# Patient Record
Sex: Female | Born: 1965 | Race: White | Hispanic: No | Marital: Married | Smoking: Former smoker
Health system: Southern US, Community
[De-identification: ages and names within clinical notes are randomized; demographics above are authoritative.]

## PROBLEM LIST (undated history)

## (undated) DIAGNOSIS — O99353 Diseases of the nervous system complicating pregnancy, third trimester: Secondary | ICD-10-CM

## (undated) DIAGNOSIS — I10 Essential (primary) hypertension: Secondary | ICD-10-CM

## (undated) DIAGNOSIS — K219 Gastro-esophageal reflux disease without esophagitis: Secondary | ICD-10-CM

## (undated) DIAGNOSIS — I82409 Acute embolism and thrombosis of unspecified deep veins of unspecified lower extremity: Secondary | ICD-10-CM

## (undated) DIAGNOSIS — G510 Bell's palsy: Secondary | ICD-10-CM

## (undated) DIAGNOSIS — R51 Headache: Secondary | ICD-10-CM

## (undated) DIAGNOSIS — J302 Other seasonal allergic rhinitis: Secondary | ICD-10-CM

## (undated) DIAGNOSIS — Z9889 Other specified postprocedural states: Secondary | ICD-10-CM

## (undated) DIAGNOSIS — S022XXA Fracture of nasal bones, initial encounter for closed fracture: Secondary | ICD-10-CM

## (undated) DIAGNOSIS — R112 Nausea with vomiting, unspecified: Secondary | ICD-10-CM

## (undated) DIAGNOSIS — M1990 Unspecified osteoarthritis, unspecified site: Secondary | ICD-10-CM

## (undated) DIAGNOSIS — D569 Thalassemia, unspecified: Secondary | ICD-10-CM

## (undated) DIAGNOSIS — IMO0001 Reserved for inherently not codable concepts without codable children: Secondary | ICD-10-CM

## (undated) HISTORY — DX: Gastro-esophageal reflux disease without esophagitis: K21.9

## (undated) HISTORY — PX: DILATION AND CURETTAGE OF UTERUS: SHX78

## (undated) HISTORY — PX: ENDOMETRIAL ABLATION: SHX621

## (undated) HISTORY — PX: CERVICAL SPINE SURGERY: SHX589

## (undated) HISTORY — PX: CHOLECYSTECTOMY: SHX55

## (undated) HISTORY — PX: TONSILLECTOMY: SUR1361

## (undated) HISTORY — DX: Essential (primary) hypertension: I10

---
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
## 1995-06-01 DIAGNOSIS — G510 Bell's palsy: Secondary | ICD-10-CM

## 1995-06-01 HISTORY — DX: Bell's palsy: G51.0

## 2000-11-02 ENCOUNTER — Encounter: Admission: RE | Admit: 2000-11-02 | Discharge: 2000-11-02 | Payer: Self-pay | Admitting: Family Medicine

## 2000-11-02 ENCOUNTER — Ambulatory Visit (HOSPITAL_COMMUNITY): Admission: RE | Admit: 2000-11-02 | Discharge: 2000-11-02 | Payer: Self-pay | Admitting: Family Medicine

## 2000-11-02 ENCOUNTER — Encounter: Payer: Self-pay | Admitting: Family Medicine

## 2002-04-06 ENCOUNTER — Encounter: Payer: Self-pay | Admitting: Family Medicine

## 2002-04-06 ENCOUNTER — Encounter: Admission: RE | Admit: 2002-04-06 | Discharge: 2002-04-06 | Payer: Self-pay | Admitting: Family Medicine

## 2002-05-31 DIAGNOSIS — R519 Headache, unspecified: Secondary | ICD-10-CM

## 2002-05-31 HISTORY — DX: Headache, unspecified: R51.9

## 2005-09-16 ENCOUNTER — Encounter: Admission: RE | Admit: 2005-09-16 | Discharge: 2005-09-16 | Payer: Self-pay | Admitting: Family Medicine

## 2007-11-21 ENCOUNTER — Encounter: Admission: RE | Admit: 2007-11-21 | Discharge: 2007-11-21 | Payer: Self-pay | Admitting: Neurological Surgery

## 2008-12-10 ENCOUNTER — Ambulatory Visit (HOSPITAL_COMMUNITY): Admission: RE | Admit: 2008-12-10 | Discharge: 2008-12-11 | Payer: Self-pay | Admitting: Neurological Surgery

## 2009-01-14 ENCOUNTER — Encounter: Admission: RE | Admit: 2009-01-14 | Discharge: 2009-01-14 | Payer: Self-pay | Admitting: Neurological Surgery

## 2009-05-06 ENCOUNTER — Encounter: Admission: RE | Admit: 2009-05-06 | Discharge: 2009-05-06 | Payer: Self-pay | Admitting: Neurological Surgery

## 2009-08-19 ENCOUNTER — Encounter: Admission: RE | Admit: 2009-08-19 | Discharge: 2009-08-19 | Payer: Self-pay | Admitting: Neurological Surgery

## 2009-11-17 ENCOUNTER — Encounter: Admission: RE | Admit: 2009-11-17 | Discharge: 2009-11-17 | Payer: Self-pay | Admitting: Neurological Surgery

## 2010-03-17 ENCOUNTER — Encounter: Admission: RE | Admit: 2010-03-17 | Discharge: 2010-03-17 | Payer: Self-pay | Admitting: Neurological Surgery

## 2010-04-02 ENCOUNTER — Encounter: Admission: RE | Admit: 2010-04-02 | Discharge: 2010-04-02 | Payer: Self-pay | Admitting: Neurological Surgery

## 2010-06-21 ENCOUNTER — Encounter: Payer: Self-pay | Admitting: Family Medicine

## 2010-09-07 LAB — CBC
Hemoglobin: 11.3 g/dL — ABNORMAL LOW (ref 12.0–15.0)
MCHC: 32.3 g/dL (ref 30.0–36.0)
RBC: 5.02 MIL/uL (ref 3.87–5.11)

## 2010-09-07 LAB — BASIC METABOLIC PANEL
Calcium: 9.5 mg/dL (ref 8.4–10.5)
GFR calc non Af Amer: >60 mL/min (ref >60)
Glucose, Bld: 104 mg/dL — ABNORMAL HIGH (ref 70–99)
Sodium: 139 mEq/L (ref 135–145)

## 2010-09-07 LAB — DIFFERENTIAL
Lymphs Abs: 2 10*3/uL (ref 0.7–4.0)
Neutro Abs: 6.6 10*3/uL (ref 1.7–7.7)
Neutrophils Relative %: 69 % (ref 43–77)

## 2010-09-07 LAB — PROTIME-INR
INR: 1 (ref 0.00–1.49)
Prothrombin Time: 13.2 seconds (ref 11.6–15.2)

## 2010-10-13 NOTE — Op Note (Signed)
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   POSTOPERATIVE DIAGNOSIS:  Cervical spondylosis with cervical disk  herniation at C5-6, C6-7 with neck and arm pain.   PROCEDURES:  1. Decompressive anterior cervical diskectomy at C5-6 and C6-7.  2. Anterior cervical arthrodesis at C5-6, C6-7 utilizing 6-mm PEEK      interbody cages packed with local autograft and Trinity stem cell      product.  3. Anterior cervical plating C5-C7 inclusive utilizing a 38-mm      Orthofix plate.   SURGEON:  Tia Alert, MD   ASSISTANT:  Kathaleen Maser. Pool, MD   ANESTHESIA:  General endotracheal,   COMPLICATIONS:  Small unintended durotomy over the takeoff of the right  C7 nerve root.   BRIEF HISTORY OF PRESENT ILLNESS:  Ms. Samantha Christensen is a 45 year old female  who has a long history of neck pain with interscapular pain with  occasional radiation down the arms.  She had MRI in the remote past and  a more recent MRI which showed disk herniations at C5-6 to the right and  C6-7 to the left.  I recommended a two-level anterior cervical  diskectomy and fusional plating.  She understood the risks, benefits,  and expected outcome and wished to proceed.   DESCRIPTION OF PROCEDURE:  The patient was taken to the operating room  and after induction of adequate generalized endotracheal anesthesia she  was placed in supine position on the operating room table.  Her right  anterior cervical region was prepped with DuraPrep and then draped in  the usual sterile fashion.  Local anesthesia 5 mL was injected  and a  small transverse incision was made to the right of midline and carried  down to the platysma which was elevated, opened and then undermined with  Metzenbaum scissors.  I then dissected a plane medial to the  sternocleidomastoid muscle and internal carotid artery and lateral to  the trachea and esophagus to expose C5-6 and C6-7.  Intraoperative  fluoroscopy confirmed my level and then the longus colli muscles were  taken down and the Shadow Line retractors were placed under this to  expose C5-6 and C6-7.  The annulus was incised and the initial  diskectomy was done with pituitary rongeurs and curved Karlin curettes.  I then used the high-speed drill to drill the endplates to prepare for  later arthrodesis.  The drill shavings were saved in a mucous trap for  later arthrodesis.  We drilled down to the level of the posterior  longitudinal ligament which was then opened with a nerve hook at each  level and then removed circumferentially while undercutting  the bodies  of C5, C6 and C7.  Through decompression of the central canal at C5-6  there was a rightward disk herniation and small spur.  This was removed  by undercutting the C5-C6 vertebral bodies.  The C6 nerve roots were  identified and decompressed into the foramina distal to the pedicle.  We  then irrigated with saline solution, measured interspace to a 6 mm and  packed the interspace with Gelfoam.  The dura was full and capacious all  the way across.  We palpated with a nerve hook circumferentially to  assure adequate decompression.  The C6-7 level was decompressed in the  exact same way undercutting the bodies of C6-C7 to decompression of the  central canal until the dura relaxed and was full and capacious.  We  palpated the circumference of the nerve hook to assure adequate  decompression.  Unfortunately, while decompressing along the medial  pedicle wall and superior pedicle wall along the C7 nerve root on the  right a small  unintended durotomy was created and there was leakage of  spinal fluid.  This was covered with Gelfoam to stop the leak and the  remainder of the decompression was completed.  A coronary dilator was  again passed along the nerve roots to assure adequate decompression and  all of central canal to assure adequate decompression.  Therefore by  visualization and palpation we thought we had a good decompression of C6-  7 just as we did at C5-6.  We then measured the interspace again to be 6  mm.  We used two 6 mm PEEK interbody cages and packed these with local  autograft and Trinity product and then tapped this into position at C5-6  and C6-7 and checked this with fluoroscopy.  We then used a 38-mm  Orthofix plate and placed two 14-mm variable angle screws in the bodies  of C5 and C6 and fixed angle screws into C7.  I locked these into plate  with locking mechanism within the plate.  I then irrigated with saline  solution, dried all bleeding points with bipolar cautery and with  Surgifoam.  I then packed the right C6-7 foramen with a small amount of  Surgifoam and then Tisseel fibrin glue to help prevent CSF leak from the  C6-7 foramen on the right.  Once meticulous hemostasis was achieved I  closed the platysma with 3-0 Vicryl, closed the subcuticular tissue with  3-0 Vicryl and closed the skin with benzoin and Steri-Strips.  The  drapes removed and sterile dressing was applied.  The patient was  awakened from general anesthesia and transferred to the recovery room in  stable condition.  At the end of the procedure, all sponge, needle and  instrument counts were correct.      Tia Alert, MD  Electronically Signed     Tia Alert, MD  Electronically Signed    DSJ/MEDQ  D:  12/10/2008  T:  12/11/2008  Job:  [id number]

## 2010-12-09 IMAGING — CR DG CERVICAL SPINE 1V
1 series · 1 of 1 positions shown · non-contrast
Comparison: Fluoroscopic spot images from 12/10/2008

CLINICAL DATA: Cervical fusion on 12/10/2008.

CERVICAL SPINE - 1 VIEW

[w c-spine lat]
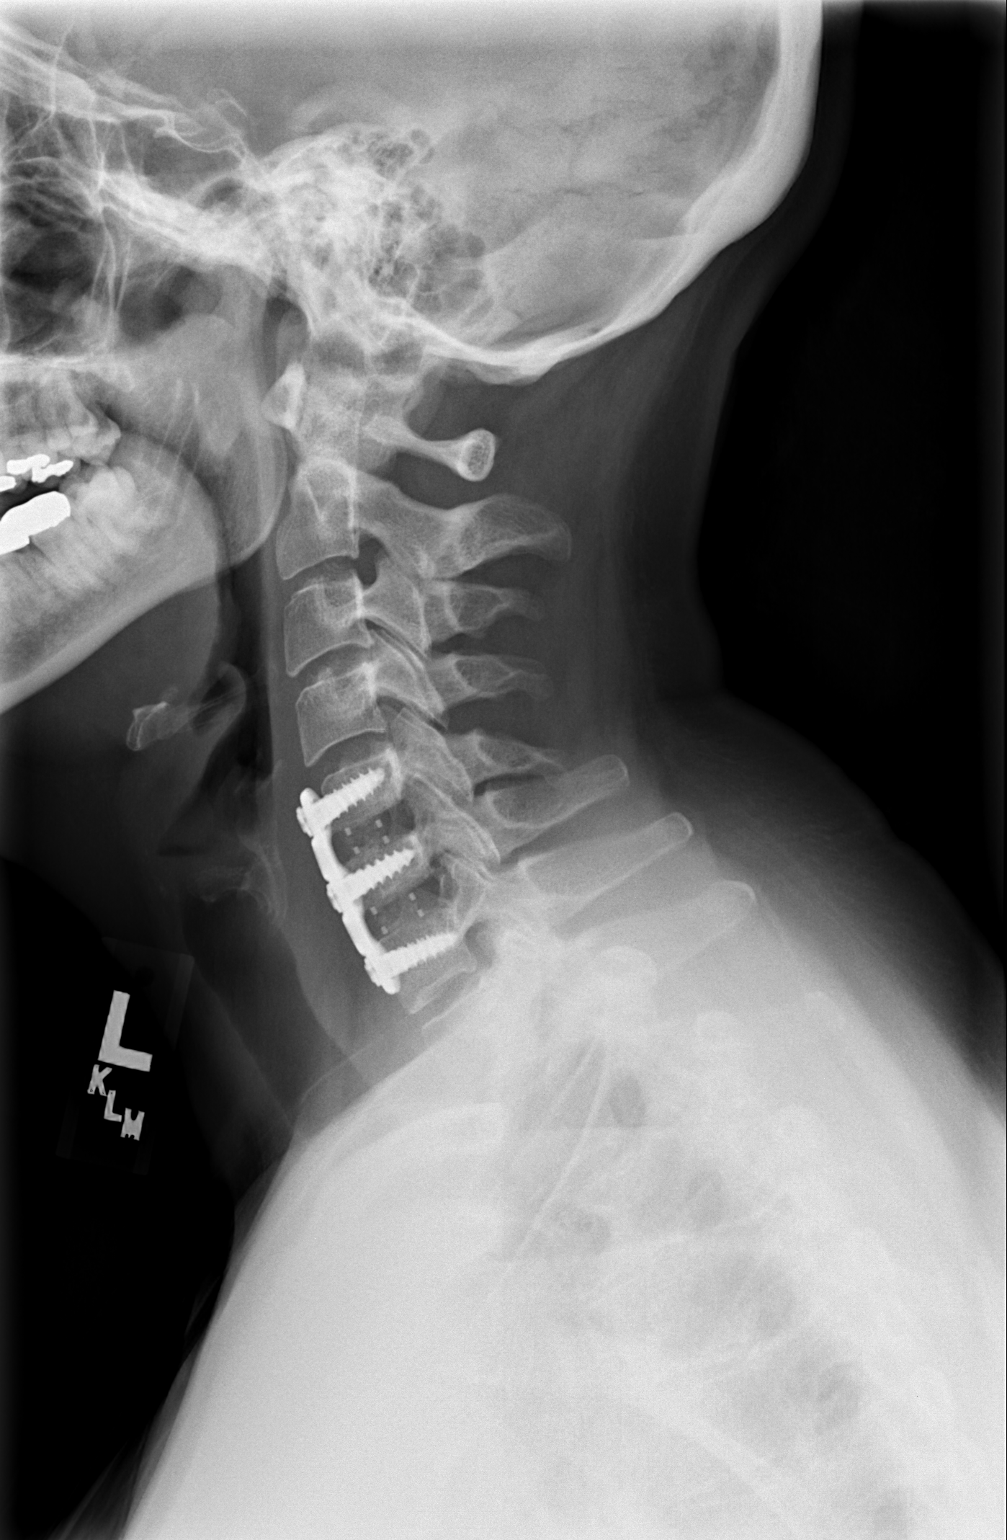

[1 of 1 positions shown; findings below may reference images not displayed]

FINDINGS: Anterior cervical discectomy and fusion noted at C5-C6-
C7, with anterior plate and screw fixator without complicating
feature.  Interbody spacers at C5-6 and C6-7 appear appropriately
positioned.  No malalignment or complicating features are observed.
IMPRESSION: 1.  Anterior fixation hardware at C5-C6-C7 is satisfactorily
positioned without complicating feature.  Interbody spacers appear
normally positioned as well.  No subluxation identified.

## 2011-01-18 ENCOUNTER — Other Ambulatory Visit: Payer: Self-pay | Admitting: Neurological Surgery

## 2011-01-18 DIAGNOSIS — M542 Cervicalgia: Secondary | ICD-10-CM

## 2011-01-20 ENCOUNTER — Ambulatory Visit
Admission: RE | Admit: 2011-01-20 | Discharge: 2011-01-20 | Disposition: A | Discharge status: Home / Self Care | Payer: BC Managed Care – PPO | Source: Ambulatory Visit | Attending: Neurological Surgery | Admitting: Neurological Surgery

## 2011-01-20 DIAGNOSIS — M542 Cervicalgia: Secondary | ICD-10-CM

## 2012-02-09 IMAGING — CR DG CERVICAL SPINE 1V
1 series · 1 of 1 positions shown · non-contrast
Comparison: 11/17/2009.

CLINICAL DATA: Neck pain.  Postop.

CERVICAL SPINE - 1 VIEW

[w c-spine lat]
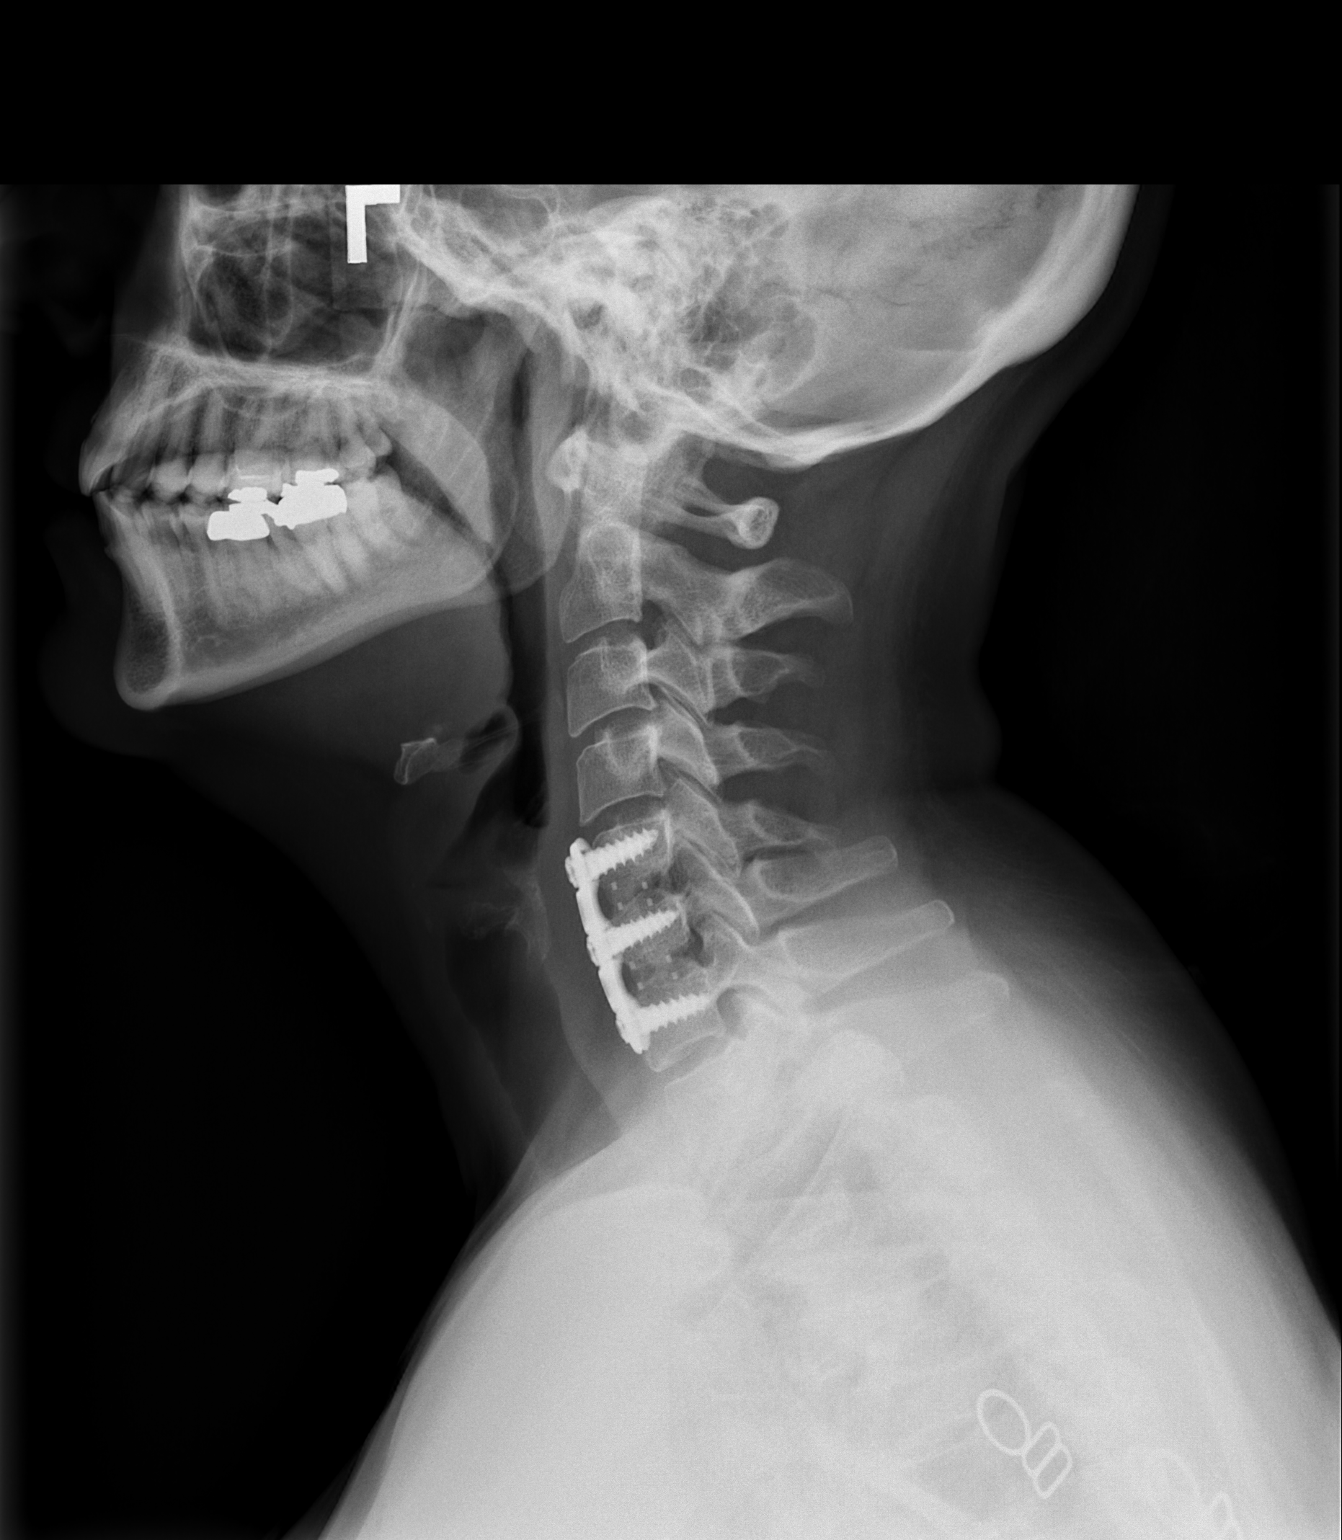

[1 of 1 positions shown; findings below may reference images not displayed]

FINDINGS: The cervical spine is visualized from the occiput to the
C7-T1 junction.  The patient is status post C5-7 anterior cervical
fusion with interbody spacers.  No hardware complications.
Alignment is anatomic.  Prevertebral soft tissues are within normal
limits.  Minimal uncovertebral hypertrophy above and below the
level of the fusion.
IMPRESSION: C5-C7 anterior cervical fusion without complicating feature.

## 2014-05-31 HISTORY — PX: KNEE ARTHROSCOPY: SHX127

## 2015-03-01 DIAGNOSIS — I82409 Acute embolism and thrombosis of unspecified deep veins of unspecified lower extremity: Secondary | ICD-10-CM

## 2015-03-01 HISTORY — DX: Acute embolism and thrombosis of unspecified deep veins of unspecified lower extremity: I82.409

## 2015-03-05 ENCOUNTER — Other Ambulatory Visit: Payer: Self-pay | Admitting: Orthopedic Surgery

## 2015-03-06 ENCOUNTER — Encounter (HOSPITAL_BASED_OUTPATIENT_CLINIC_OR_DEPARTMENT_OTHER): Payer: Self-pay | Admitting: *Deleted

## 2015-03-07 NOTE — H&P (Signed)
Samantha Christensen is an 49 y.o. female.   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  She had the immediate onset of deformity in the long finger, pain in the wrist and injured her nose/face.  She was evaluated at [facility] in the emergency room where x-rays were obtained.  It is my understanding that her long finger PIP dislocation was closed reduced, her distal radius fracture was splinted with a sugar tong splint in situ, and no additional radiographs were obtained.  She also had a laceration on the bridge of her nose addressed.  She has been taking intermittent narcotic pain medicines and Zofran as needed for nausea  Past Medical History  Diagnosis Date  . Thalassemia- hemoglobin I disease (HCC) 1985  . DJD (degenerative joint disease)   . PONV (postoperative nausea and vomiting)   . Nasal fracture     broke it when she broke her radius, stitches under nose.    Past Surgical History  Procedure Laterality Date  . Knee arthroscopy  2016  . Dilation and curettage of uterus    . Cholecystectomy    . Tonsillectomy    . Endometrial ablation    . Cervical spine surgery    . Cesarean section      x2    History reviewed. No pertinent family history. Social History:  reports that she quit smoking about 23 years ago. She has never used smokeless tobacco. She reports that she does not drink alcohol or use illicit drugs.  Allergies: No Known Allergies  No prescriptions prior to admission    No results found for this or any previous visit (from the past 48 hour(s)). No results found.  Review of Systems  All other systems reviewed and are negative.   Height  (1.626 m), weight 86.183 kg (190 lb). Physical Exam  Constitutional:  WD, WN,  NAD HEENT:  NCAT, EOMI Neuro/Psych:  Alert & oriented to person, place, and time; appropriate mood & affect Lymphatic: No generalized UE edema or lymphadenopathy Extremities / MSK:  Both UE are normal with respect to appearance, ranges of motion, joint stability, muscle strength/tone, sensation, & perfusion except as otherwise noted:  The sugar tong splint extends nearly to the PIP joints both palmarly and dorsally.  She also complains of how it cuts and around the elbow.  Therefore the sugar tong is removed.  The PIP joint of the long finger appears grossly reduced, and although the digit is swollen, there is reasonable motion at the PIP that seems smooth and fluid.  Intact light touch sensibility across the digital tips, and intact motor in radial, median, and ulnar nerve distributions.  The fingers are puffy.  Labs / Xrays:  3 views of the right long finger ordered and obtained as best could be done before the splint was removed reveals what appears to be a concentric reduction of the PIP joint without any significant fractures.  Injury x-rays are reviewed revealing a comminuted intra-articular distal radius fracture with 25 dorsal tilt and some intra-articular incongruity/loss of radial inclination/shortening.  Assessment: 1.  Status post closed reduction right long finger PIP joint dislocation 2.  Unacceptably displaced right distal radius intra-articular comminuted fracture  Plan:  In addition to removing the sugar tong splint  and reapplying a short arm volar splint so that the digits could move and the elbow was free, we discussed her findings.  I recommended that she work diligently on range of motion exercises for the dislocated digit, as well as the other digits.  Elevation can still be helpful to minimize swelling.  I recommended operative treatment to obtain and maintain a more acceptable reduction and alignment of her fracture.  Plastic models were used in the discussion and radiographs  were reviewed.  We will plan to proceed next Monday.  The details of the operative procedure were discussed with the patient.  Questions were invited and answered.  In addition to the goal of the procedure, the risks of the procedure to include but not limited to bleeding; infection; damage to the nerves or blood vessels that could result in bleeding, numbness, weakness, chronic pain, and the need for additional procedures; stiffness; the need for revision surgery; and anesthetic risks, were reviewed.  No specific outcome was guaranteed or implied.  Informed consent was obtained.  Prescriptions were rendered for Percocet and Zofran.  Cainan Trull A. 03/07/2015, 12:43 PM

## 2015-03-10 ENCOUNTER — Ambulatory Visit (HOSPITAL_BASED_OUTPATIENT_CLINIC_OR_DEPARTMENT_OTHER): Payer: BC Managed Care – PPO | Admitting: Anesthesiology

## 2015-03-10 ENCOUNTER — Encounter (HOSPITAL_BASED_OUTPATIENT_CLINIC_OR_DEPARTMENT_OTHER): Payer: Self-pay | Admitting: Certified Registered"

## 2015-03-10 ENCOUNTER — Ambulatory Visit (HOSPITAL_BASED_OUTPATIENT_CLINIC_OR_DEPARTMENT_OTHER)
Admission: RE | Admit: 2015-03-10 | Discharge: 2015-03-10 | Disposition: A | Discharge status: Home / Self Care | Payer: BC Managed Care – PPO | Source: Ambulatory Visit | Attending: Orthopedic Surgery | Admitting: Orthopedic Surgery

## 2015-03-10 ENCOUNTER — Encounter (HOSPITAL_BASED_OUTPATIENT_CLINIC_OR_DEPARTMENT_OTHER)
Admission: RE | Disposition: A | Discharge status: Home / Self Care | Payer: Self-pay | Source: Ambulatory Visit | Attending: Orthopedic Surgery

## 2015-03-10 ENCOUNTER — Ambulatory Visit (HOSPITAL_COMMUNITY): Payer: BC Managed Care – PPO

## 2015-03-10 DIAGNOSIS — S52571A Other intraarticular fracture of lower end of right radius, initial encounter for closed fracture: Secondary | ICD-10-CM | POA: Diagnosis present

## 2015-03-10 DIAGNOSIS — W109XXA Fall (on) (from) unspecified stairs and steps, initial encounter: Secondary | ICD-10-CM | POA: Insufficient documentation

## 2015-03-10 DIAGNOSIS — Z87891 Personal history of nicotine dependence: Secondary | ICD-10-CM | POA: Insufficient documentation

## 2015-03-10 DIAGNOSIS — Z419 Encounter for procedure for purposes other than remedying health state, unspecified: Secondary | ICD-10-CM

## 2015-03-10 HISTORY — PX: OPEN REDUCTION INTERNAL FIXATION (ORIF) DISTAL RADIAL FRACTURE: SHX5989

## 2015-03-10 HISTORY — DX: Unspecified osteoarthritis, unspecified site: M19.90

## 2015-03-10 HISTORY — DX: Nausea with vomiting, unspecified: Z98.890

## 2015-03-10 HISTORY — DX: Fracture of nasal bones, initial encounter for closed fracture: S02.2XXA

## 2015-03-10 HISTORY — DX: Thalassemia, unspecified: D56.9

## 2015-03-10 HISTORY — DX: Nausea with vomiting, unspecified: R11.2

## 2015-03-10 LAB — POCT HEMOGLOBIN-HEMACUE: Hemoglobin: 12 g/dL (ref 12.0–15.0)

## 2015-03-10 SURGERY — OPEN REDUCTION INTERNAL FIXATION (ORIF) DISTAL RADIUS FRACTURE
Anesthesia: Regional | Site: Wrist | Laterality: Right

## 2015-03-10 MED ORDER — PROPOFOL 10 MG/ML IV BOLUS
INTRAVENOUS | Status: DC | PRN
  Administered 2015-03-10: 50 mg via INTRAVENOUS

## 2015-03-10 MED ORDER — LACTATED RINGERS IV SOLN
INTRAVENOUS | Status: DC
  Administered 2015-03-10 (×2): via INTRAVENOUS

## 2015-03-10 MED ORDER — HYDROMORPHONE HCL 1 MG/ML IJ SOLN: 0.2500 mg | INTRAMUSCULAR | Status: DC | PRN

## 2015-03-10 MED ORDER — MIDAZOLAM HCL 2 MG/2ML IJ SOLN
1.0000 mg | INTRAMUSCULAR | Status: DC | PRN
  Administered 2015-03-10 (×2): 2 mg via INTRAVENOUS

## 2015-03-10 MED ORDER — LACTATED RINGERS IV SOLN: INTRAVENOUS | Status: DC

## 2015-03-10 MED ORDER — PROPOFOL 500 MG/50ML IV EMUL
INTRAVENOUS | Status: DC | PRN
  Administered 2015-03-10: 100 ug/kg/min via INTRAVENOUS

## 2015-03-10 MED ORDER — MIDAZOLAM HCL 2 MG/2ML IJ SOLN
INTRAMUSCULAR | Status: AC
  Filled 2015-03-10: qty 4

## 2015-03-10 MED ORDER — PROMETHAZINE HCL 25 MG/ML IJ SOLN: 6.2500 mg | INTRAMUSCULAR | Status: DC | PRN

## 2015-03-10 MED ORDER — CEFAZOLIN SODIUM-DEXTROSE 2-3 GM-% IV SOLR
INTRAVENOUS | Status: AC
  Filled 2015-03-10: qty 50

## 2015-03-10 MED ORDER — DEXAMETHASONE SODIUM PHOSPHATE 10 MG/ML IJ SOLN
INTRAMUSCULAR | Status: AC
  Filled 2015-03-10: qty 1

## 2015-03-10 MED ORDER — DEXAMETHASONE SODIUM PHOSPHATE 10 MG/ML IJ SOLN
INTRAMUSCULAR | Status: DC | PRN
  Administered 2015-03-10: 10 mg via INTRAVENOUS

## 2015-03-10 MED ORDER — MIDAZOLAM HCL 2 MG/2ML IJ SOLN
INTRAMUSCULAR | Status: AC
  Filled 2015-03-10: qty 2

## 2015-03-10 MED ORDER — SCOPOLAMINE 1 MG/3DAYS TD PT72
1.0000 | MEDICATED_PATCH | Freq: Once | TRANSDERMAL | Status: DC | PRN
Start: 2015-03-10 — End: 2015-03-10
  Administered 2015-03-10: 1.5 mg via TRANSDERMAL

## 2015-03-10 MED ORDER — CEFAZOLIN SODIUM-DEXTROSE 2-3 GM-% IV SOLR
2.0000 g | INTRAVENOUS | Status: AC
  Administered 2015-03-10: 2 g via INTRAVENOUS

## 2015-03-10 MED ORDER — BUPIVACAINE-EPINEPHRINE (PF) 0.5% -1:200000 IJ SOLN
INTRAMUSCULAR | Status: DC | PRN
  Administered 2015-03-10: 30 mL via PERINEURAL

## 2015-03-10 MED ORDER — FENTANYL CITRATE (PF) 100 MCG/2ML IJ SOLN
INTRAMUSCULAR | Status: AC
  Filled 2015-03-10: qty 4

## 2015-03-10 MED ORDER — LIDOCAINE HCL (CARDIAC) 20 MG/ML IV SOLN
INTRAVENOUS | Status: AC
  Filled 2015-03-10: qty 5

## 2015-03-10 MED ORDER — PROPOFOL 500 MG/50ML IV EMUL
INTRAVENOUS | Status: AC
  Filled 2015-03-10: qty 50

## 2015-03-10 MED ORDER — LIDOCAINE HCL (CARDIAC) 20 MG/ML IV SOLN
INTRAVENOUS | Status: DC | PRN
  Administered 2015-03-10: 30 mg via INTRAVENOUS

## 2015-03-10 MED ORDER — MEPERIDINE HCL 25 MG/ML IJ SOLN: 6.2500 mg | INTRAMUSCULAR | Status: DC | PRN

## 2015-03-10 MED ORDER — ONDANSETRON HCL 4 MG/2ML IJ SOLN
INTRAMUSCULAR | Status: AC
  Filled 2015-03-10: qty 2

## 2015-03-10 MED ORDER — ONDANSETRON HCL 4 MG/2ML IJ SOLN
INTRAMUSCULAR | Status: DC | PRN
  Administered 2015-03-10: 4 mg via INTRAVENOUS

## 2015-03-10 MED ORDER — SCOPOLAMINE 1 MG/3DAYS TD PT72
MEDICATED_PATCH | TRANSDERMAL | Status: AC
  Filled 2015-03-10: qty 1

## 2015-03-10 MED ORDER — FENTANYL CITRATE (PF) 100 MCG/2ML IJ SOLN
INTRAMUSCULAR | Status: AC
  Filled 2015-03-10: qty 2

## 2015-03-10 MED ORDER — GLYCOPYRROLATE 0.2 MG/ML IJ SOLN
0.2000 mg | Freq: Once | INTRAMUSCULAR | Status: DC | PRN
Start: 2015-03-10 — End: 2015-03-10

## 2015-03-10 MED ORDER — FENTANYL CITRATE (PF) 100 MCG/2ML IJ SOLN
50.0000 ug | INTRAMUSCULAR | Status: DC | PRN
  Administered 2015-03-10: 100 ug via INTRAVENOUS

## 2015-03-10 SURGICAL SUPPLY — 69 items
.062 K-WIRE ×2 IMPLANT
BANDAGE COBAN STERILE 2 (GAUZE/BANDAGES/DRESSINGS) IMPLANT
BIT DRILL 2 FAST STEP (BIT) ×2 IMPLANT
BIT DRILL 2.5X4 QC (BIT) ×2 IMPLANT
BLADE MINI RND TIP GREEN BEAV (BLADE) IMPLANT
BLADE SURG 15 STRL LF DISP TIS (BLADE) ×1 IMPLANT
BLADE SURG 15 STRL SS (BLADE) ×6
BNDG CMPR 9X4 STRL LF SNTH (GAUZE/BANDAGES/DRESSINGS) ×1
BNDG COHESIVE 4X5 TAN STRL (GAUZE/BANDAGES/DRESSINGS) ×3 IMPLANT
BNDG ESMARK 4X9 LF (GAUZE/BANDAGES/DRESSINGS) ×3 IMPLANT
BNDG GAUZE ELAST 4 BULKY (GAUZE/BANDAGES/DRESSINGS) ×6 IMPLANT
BRUSH SCRUB EZ PLAIN DRY (MISCELLANEOUS) ×2 IMPLANT
CANISTER SUCT 1200ML W/VALVE (MISCELLANEOUS) ×3 IMPLANT
CHLORAPREP W/TINT 26ML (MISCELLANEOUS) ×3 IMPLANT
CORDS BIPOLAR (ELECTRODE) ×3 IMPLANT
COVER BACK TABLE 60X90IN (DRAPES) ×3 IMPLANT
COVER MAYO STAND STRL (DRAPES) ×3 IMPLANT
CUFF TOURNIQUET SINGLE 18IN (TOURNIQUET CUFF) ×2 IMPLANT
CUFF TOURNIQUET SINGLE 24IN (TOURNIQUET CUFF) IMPLANT
DRAPE C-ARM 42X72 X-RAY (DRAPES) ×3 IMPLANT
DRAPE EXTREMITY T 121X128X90 (DRAPE) ×3 IMPLANT
DRAPE SURG 17X23 STRL (DRAPES) ×3 IMPLANT
DRSG ADAPTIC 3X8 NADH LF (GAUZE/BANDAGES/DRESSINGS) ×1 IMPLANT
DRSG EMULSION OIL 3X3 NADH (GAUZE/BANDAGES/DRESSINGS) ×2 IMPLANT
ELECT REM PT RETURN 9FT ADLT (ELECTROSURGICAL) ×3
ELECTRODE REM PT RTRN 9FT ADLT (ELECTROSURGICAL) ×1 IMPLANT
GAUZE SPONGE 4X4 12PLY STRL (GAUZE/BANDAGES/DRESSINGS) ×3 IMPLANT
GLOVE BIO SURGEON STRL SZ7.5 (GLOVE) ×3 IMPLANT
GLOVE BIOGEL PI IND STRL 7.0 (GLOVE) ×1 IMPLANT
GLOVE BIOGEL PI IND STRL 8 (GLOVE) ×1 IMPLANT
GLOVE BIOGEL PI INDICATOR 7.0 (GLOVE) ×6
GLOVE BIOGEL PI INDICATOR 8 (GLOVE) ×2
GLOVE ECLIPSE 6.5 STRL STRAW (GLOVE) ×5 IMPLANT
GOWN STRL REUS W/ TWL LRG LVL3 (GOWN DISPOSABLE) ×2 IMPLANT
GOWN STRL REUS W/TWL LRG LVL3 (GOWN DISPOSABLE) ×6
GOWN STRL REUS W/TWL XL LVL3 (GOWN DISPOSABLE) ×3 IMPLANT
NDL HYPO 25X1 1.5 SAFETY (NEEDLE) IMPLANT
NEEDLE HYPO 25X1 1.5 SAFETY (NEEDLE) IMPLANT
NS IRRIG 1000ML POUR BTL (IV SOLUTION) ×3 IMPLANT
PACK BASIN DAY SURGERY FS (CUSTOM PROCEDURE TRAY) ×3 IMPLANT
PADDING CAST ABS 4INX4YD NS (CAST SUPPLIES) ×2
PADDING CAST ABS COTTON 4X4 ST (CAST SUPPLIES) IMPLANT
PEG SUBCHONDRAL SMOOTH 2.0X18 (Peg) ×6 IMPLANT
PEG SUBCHONDRAL SMOOTH 2.0X20 (Peg) ×6 IMPLANT
PEG THREADED 2.5MMX20MM LONG (Peg) ×2 IMPLANT
PENCIL BUTTON HOLSTER BLD 10FT (ELECTRODE) ×3 IMPLANT
PLATE SHORT 24.4X51.3 RT (Plate) ×2 IMPLANT
RUBBERBAND STERILE (MISCELLANEOUS) IMPLANT
SCREW BN 12X3.5XNS CORT TI (Screw) IMPLANT
SCREW CORT 3.5X10 LNG (Screw) ×4 IMPLANT
SCREW CORT 3.5X12 (Screw) ×3 IMPLANT
SLEEVE SCD COMPRESS KNEE MED (MISCELLANEOUS) ×3 IMPLANT
SLING ARM FOAM STRAP LRG (SOFTGOODS) ×2 IMPLANT
SPLINT PLASTER CAST XFAST 3X15 (CAST SUPPLIES) IMPLANT
SPLINT PLASTER XTRA FASTSET 3X (CAST SUPPLIES) ×20
STOCKINETTE 6  STRL (DRAPES) ×2
STOCKINETTE 6 STRL (DRAPES) ×1 IMPLANT
SUCTION FRAZIER TIP 10 FR DISP (SUCTIONS) ×3 IMPLANT
SUT VIC AB 2-0 PS2 27 (SUTURE) ×3 IMPLANT
SUT VICRYL 4-0 PS2 18IN ABS (SUTURE) IMPLANT
SUT VICRYL RAPIDE 4-0 (SUTURE) IMPLANT
SUT VICRYL RAPIDE 4/0 PS 2 (SUTURE) ×3 IMPLANT
SYR BULB 3OZ (MISCELLANEOUS) ×3 IMPLANT
SYRINGE 10CC LL (SYRINGE) IMPLANT
TOWEL OR 17X24 6PK STRL BLUE (TOWEL DISPOSABLE) ×3 IMPLANT
TOWEL OR NON WOVEN STRL DISP B (DISPOSABLE) ×3 IMPLANT
TUBE CONNECTING 20'X1/4 (TUBING) ×1
TUBE CONNECTING 20X1/4 (TUBING) ×2 IMPLANT
UNDERPAD 30X30 (UNDERPADS AND DIAPERS) ×3 IMPLANT

## 2015-03-10 NOTE — Anesthesia Procedure Notes (Addendum)
Anesthesia Regional Block:  Interscalene brachial plexus block  Pre-Anesthetic Checklist: ,, timeout performed, Correct Patient, Correct Site, Correct Laterality, Correct Procedure, Correct Position, site marked, Risks and benefits discussed, Surgical consent,  Pre-op evaluation,  Post-op pain management  Laterality: Right  Prep: chloraprep       Needles:  Injection technique: Single-shot  Needle Type: Stimulator Needle - 40     Needle Length: 4cm 4 cm Needle Gauge: 22 and 22 G    Additional Needles:  Procedures: ultrasound guided (picture in chart) Interscalene brachial plexus block Narrative:  Injection made incrementally with aspirations every 5 mL. Anesthesiologist: Lewie Loron  Additional Notes: BP cuff, EKG monitors applied. Sedation begun. Nerve location verified with U/S. Anesthetic injected incrementally, slowly , and after neg aspirations under direct u/s guidance. Good perineural spread. Tolerated well.   Procedure Name: LMA Insertion Date/Time: 03/10/2015 9:38 AM Performed by: Keelan Pomerleau D Pre-anesthesia Checklist: Patient identified, Emergency Drugs available, Suction available and Patient being monitored Patient Re-evaluated:Patient Re-evaluated prior to inductionOxygen Delivery Method: Circle System Utilized Preoxygenation: Pre-oxygenation with 100% oxygen Intubation Type: IV induction Ventilation: Mask ventilation without difficulty LMA: LMA inserted LMA Size: 4.0 Number of attempts: 1 Airway Equipment and Method: Bite block Placement Confirmation: positive ETCO2 Tube secured with: Tape Dental Injury: Teeth and Oropharynx as per pre-operative assessment

## 2015-03-10 NOTE — Anesthesia Postprocedure Evaluation (Addendum)
Anesthesia Post Note  Patient: Samantha Christensen  Procedure(s) Performed: Procedure(s) (LRB): OPEN TREATMENT OF RIGHT DISTAL RADIUS FRACTURE (Right)  Anesthesia type: axillary block  Patient location: PACU  Post pain: Pain level controlled  Post assessment: Post-op Vital signs reviewed  Last Vitals: BP 151/80 mmHg  Pulse 90  Temp(Src) 37.1 C (Oral)  Resp 16  Ht  (1.626 m)  Wt 187 lb (84.823 kg)  BMI 32.08 kg/m2  SpO2 97%  Post vital signs: Reviewed  Level of consciousness: awake  Complications: No apparent anesthesia complications

## 2015-03-10 NOTE — Op Note (Signed)
03/10/2015  9:07 AM  PATIENT:  Samantha Christensen  49 y.o. female  PRE-OPERATIVE DIAGNOSIS:  Comminuted intra-articular right distal radius fracture  POST-OPERATIVE DIAGNOSIS:  Same  PROCEDURE:  ORIF comminuted right intra-articular distal radius fracture, 25609  SURGEON: Cliffton Asters. Janee Morn, MD  PHYSICIAN ASSISTANT: Danielle Rankin, OPA-C  ANESTHESIA:  regional and general  SPECIMENS:  None  DRAINS: None  EBL:  less than 50 mL  PREOPERATIVE INDICATIONS:  Samantha Christensen is a  49 y.o. female with a comminuted displaced intra-articular right distal radius fracture following a fall.  The risks benefits and alternatives were discussed with the patient preoperatively including but not limited to the risks of infection, bleeding, nerve injury, cardiopulmonary complications, the need for revision surgery, among others, and the patient verbalized understanding and consented to proceed.  OPERATIVE IMPLANTS: Bioment DVR standard width, 3-hole plate with screws/pegs  OPERATIVE PROCEDURE: After receiving prophylactic antibiotics and a regional block, the patient was escorted to the operative theatre and placed in a supine position. General anesthesia was administered.  A surgical "time-out" was performed during which the planned procedure, proposed operative site, and the correct patient identity were compared to the operative consent and agreement confirmed by the circulating nurse according to current facility policy. Following application of a tourniquet to the operative extremity, the exposed skin was pre-scrub with Hibiclens scrub brush and then was prepped with Chloraprep and draped in the usual sterile fashion. The limb was exsanguinated with an Esmarch bandage and the tourniquet inflated to approximately higher than systolic BP.   A sinusoidal-shaped incision was marked and made over the FCR axis and the distal forearm. The skin was incised sharply with scalpel, subcutaneous tissues  with blunt and spreading dissection. The FCR axis was exploited deeply. The pronator quadratus was reflected in an L-shaped ulnarly and the brachioradialis was split in a Z-plasty fashion for later reapproximation. The fracture was inspected and provisionally reduced.  This was confirmed fluoroscopically. The appropriately sized plate was selected and found to fit well. It was placed in its provisional alignment of the radius and this was confirmed fluoroscopically.  It was secured to the radius with a screw through the slotted hole.  Additional adjustments were made as necessary, and the distal holes were all drilled and filled.  Peg/screw length distally was selected on the shorter side of measurements to minimize the risk for dorsal cortical penetration. The remainder of the proximal holes were drilled and filled.   Final images were obtained and the DRUJ was examined for stability. It was found to be sufficiently stable. The wound was then copiously irrigated and the brachioradialis repaired with 2-0 Vicryl Rapide suture followed by repair of the pronator quadratus with the same suture type. Tourniquet was released and additional hemostasis obtained and the skin was closed with 2-0 Vicryl deep dermal buried sutures followed by running 4-0 Vicryl Rapide horizontal mattress suture in the skin. A bulky dressing with a volar plaster component was applied and she was taken to room stable condition.  DISPOSITION: The patient will be discharged home today with typical post-op instructions, returning in 10-15 days for reevaluation with new x-rays of the affected wrist out of the splint to include an inclined lateral and then transition to therapy to have a custom splint constructed and begin rehabilitation.

## 2015-03-10 NOTE — Discharge Instructions (Signed)
Discharge Instructions ° ° °You have a dressing with a plaster splint incorporated in it. °Move your fingers as much as possible, making a full fist and fully opening the fist. °Elevate your hand to reduce pain & swelling of the digits.  Ice over the operative site may be helpful to reduce pain & swelling.  DO NOT USE HEAT. °Pain medicine has been prescribed for you.  °Use your medicine as needed over the first 48 hours, and then you can begin to taper your use.  You may use Tylenol in place of your prescribed pain medication, but not IN ADDITION to it. °Leave the dressing in place until you return to our office.  °You may shower, but keep the bandage clean & dry.  °You may drive a car when you are off of prescription pain medications and can safely control your vehicle with both hands. °Our office will call you to arrange follow-up ° ° °Please call [phone] during normal business hours or [phone] after hours for any problems. Including the following: ° °- excessive redness of the incisions °- drainage for more than 4 days °- fever of more than 101.5 F ° °*Please note that pain medications will not be refilled after hours or on weekends. ° ° °Regional Anesthesia Blocks ° °1. Numbness or the inability to move the "blocked" extremity may last from 3-48 hours after placement. The length of time depends on the medication injected and your individual response to the medication. If the numbness is not going away after 48 hours, call your surgeon. ° °2. The extremity that is blocked will need to be protected until the numbness is gone and the  Strength has returned. Because you cannot feel it, you will need to take extra care to avoid injury. Because it may be weak, you may have difficulty moving it or using it. You may not know what position it is in without looking at it while the block is in effect. ° °3. For blocks in the legs and feet, returning to weight bearing and walking needs to be done carefully. You  will need to wait until the numbness is entirely gone and the strength has returned. You should be able to move your leg and foot normally before you try and bear weight or walk. You will need someone to be with you when you first try to ensure you do not fall and possibly risk injury. ° °4. Bruising and tenderness at the needle site are common side effects and will resolve in a few days. ° °5. Persistent numbness or new problems with movement should be communicated to the surgeon or the  Surgery Center ([phone])/ [facility] ([phone]). ° °Post Anesthesia Home Care Instructions ° °Activity: °Get plenty of rest for the remainder of the day. A responsible adult should stay with you for 24 hours following the procedure.  °For the next 24 hours, DO NOT: °-Drive a car °-Operate machinery °-Drink alcoholic beverages °-Take any medication unless instructed by your physician °-Make any legal decisions or sign important papers. ° °Meals: °Start with liquid foods such as gelatin or soup. Progress to regular foods as tolerated. Avoid greasy, spicy, heavy foods. If nausea and/or vomiting occur, drink only clear liquids until the nausea and/or vomiting subsides. Call your physician if vomiting continues. ° °Special Instructions/Symptoms: °Your throat may feel dry or sore from the anesthesia or the breathing tube placed in your throat during surgery. If this causes discomfort, gargle with warm salt water.   The discomfort should disappear within 24 hours. ° °If you had a scopolamine patch placed behind your ear for the management of post- operative nausea and/or vomiting: ° °1. The medication in the patch is effective for 72 hours, after which it should be removed.  Wrap patch in a tissue and discard in the trash. Wash hands thoroughly with soap and water. °2. You may remove the patch earlier than 72 hours if you experience unpleasant side effects which may include dry mouth, dizziness or visual  disturbances. °3. Avoid touching the patch. Wash your hands with soap and water after contact with the patch. °  ° °

## 2015-03-10 NOTE — Progress Notes (Signed)
Assisted Dr. Germeroth with right, ultrasound guided, axillary block. Side rails up, monitors on throughout procedure. See vital signs in flow sheet. Tolerated Procedure well. 

## 2015-03-10 NOTE — Anesthesia Preprocedure Evaluation (Signed)
Anesthesia Evaluation  Patient identified by MRN, date of birth, ID band Patient awake    Reviewed: Allergy & Precautions, NPO status , Patient's Chart, lab work & pertinent test results  History of Anesthesia Complications (+) PONV and history of anesthetic complications  Airway Mallampati: II  TM Distance: >3 FB Neck ROM: Full    Dental no notable dental hx.    Pulmonary neg pulmonary ROS, former smoker,    Pulmonary exam normal breath sounds clear to auscultation       Cardiovascular negative cardio ROS Normal cardiovascular exam Rhythm:Regular Rate:Normal     Neuro/Psych negative neurological ROS  negative psych ROS   GI/Hepatic negative GI ROS, Neg liver ROS,   Endo/Other  negative endocrine ROS  Renal/GU negative Renal ROS     Musculoskeletal  (+) Arthritis ,   Abdominal   Peds  Hematology  (+) anemia ,   Anesthesia Other Findings   Reproductive/Obstetrics negative OB ROS                             Anesthesia Physical Anesthesia Plan  ASA: II  Anesthesia Plan: Regional   Post-op Pain Management:    Induction: Intravenous  Airway Management Planned:   Additional Equipment:   Intra-op Plan:   Post-operative Plan:   Informed Consent: I have reviewed the patients History and Physical, chart, labs and discussed the procedure including the risks, benefits and alternatives for the proposed anesthesia with the patient or authorized representative who has indicated his/her understanding and acceptance.   Dental advisory given  Plan Discussed with: CRNA  Anesthesia Plan Comments:         Anesthesia Quick Evaluation

## 2015-03-10 NOTE — Transfer of Care (Signed)
Immediate Anesthesia Transfer of Care Note  Patient: Samantha Christensen  Procedure(s) Performed: Procedure(s): OPEN TREATMENT OF RIGHT DISTAL RADIUS FRACTURE (Right)  Patient Location: PACU  Anesthesia Type:GA combined with regional for post-op pain  Level of Consciousness: awake, alert , oriented and patient cooperative  Airway & Oxygen Therapy: Patient Spontanous Breathing and Patient connected to face mask oxygen  Post-op Assessment: Report given to RN and Post -op Vital signs reviewed and stable  Post vital signs: Reviewed and stable  Last Vitals:  Filed Vitals:   03/10/15 0835  BP: 144/60  Pulse: 93  Temp:   Resp: 20    Complications: No apparent anesthesia complications

## 2015-03-10 NOTE — Interval H&P Note (Signed)
History and Physical Interval Note:  03/10/2015 9:06 AM  Samantha Christensen  has presented today for surgery, with the diagnosis of RIGHT DISTAL RADIUS FRACTURE  The various methods of treatment have been discussed with the patient and family. After consideration of risks, benefits and other options for treatment, the patient has consented to  Procedure(s): OPEN TREATMENT OF RIGHT DISTAL RADIUS FRACTURE (Right) as a surgical intervention .  The patient's history has been reviewed, patient examined, no change in status, stable for surgery.  I have reviewed the patient's chart and labs.  Questions were answered to the patient's satisfaction.     Matsuko Kretz A.

## 2015-03-11 ENCOUNTER — Encounter (HOSPITAL_BASED_OUTPATIENT_CLINIC_OR_DEPARTMENT_OTHER): Payer: Self-pay | Admitting: Orthopedic Surgery

## 2015-08-28 DIAGNOSIS — D569 Thalassemia, unspecified: Secondary | ICD-10-CM | POA: Insufficient documentation

## 2015-08-28 DIAGNOSIS — I10 Essential (primary) hypertension: Secondary | ICD-10-CM | POA: Insufficient documentation

## 2015-08-28 DIAGNOSIS — M5030 Other cervical disc degeneration, unspecified cervical region: Secondary | ICD-10-CM | POA: Insufficient documentation

## 2015-12-01 ENCOUNTER — Other Ambulatory Visit: Payer: Self-pay | Admitting: Orthopedic Surgery

## 2015-12-11 NOTE — Pre-Procedure Instructions (Signed)
    Samantha Christensen  12/11/2015    Your procedure is scheduled on Monday, July 24.  [facility] at  1:00 PM                Your surgery or procedure is scheduled for 3:00 PM   Call this number if you have problems the morning of [phone]                For any other questions, please call [phone], Monday - Friday 8 AM - 4 PM.   Remember:  Do not eat food or drink liquids after midnight Sunday, July 23.  Take these medicines the morning of surgery with A SIP OF WATER : loratadine (CLARITIN), PARoxetine (PAXIL).                  Monday, July 17 STOP taking glucosamine-chondroitin. Do Not take any Herbal Medications, Vitamins, Ibuprofen (Advil), Naproxen (Aleve), Aspirin or Aspirin Products.    Do not wear jewelry, make-up or nail polish.  Do not wear lotions, powders, or perfumes.  Do not shave 48 hours prior to surgery.  Do not bring valuables to the hospital.  [facility] is not responsible for any belongings or valuables.  Contacts, dentures or bridgework may not be worn into surgery.  Leave your suitcase in the car.  After surgery it may be brought to your room.  Special instructions:  Review  [place] - Preparing For Surgery.  Please read over the following fact sheets that you were given: [facility] Pre[facility] and Patient Instructions for Mupirocin Application. Incentive Spirometery

## 2015-12-12 ENCOUNTER — Encounter (HOSPITAL_COMMUNITY): Payer: Self-pay

## 2015-12-12 ENCOUNTER — Ambulatory Visit (HOSPITAL_COMMUNITY)
Admission: RE | Admit: 2015-12-12 | Discharge: 2015-12-12 | Disposition: A | Discharge status: Home / Self Care | Payer: BC Managed Care – PPO | Source: Ambulatory Visit | Attending: Orthopedic Surgery | Admitting: Orthopedic Surgery

## 2015-12-12 ENCOUNTER — Encounter (HOSPITAL_COMMUNITY)
Admission: RE | Admit: 2015-12-12 | Discharge: 2015-12-12 | Disposition: A | Discharge status: Home / Self Care | Payer: BC Managed Care – PPO | Source: Ambulatory Visit | Attending: Orthopedic Surgery | Admitting: Orthopedic Surgery

## 2015-12-12 DIAGNOSIS — Z01818 Encounter for other preprocedural examination: Secondary | ICD-10-CM | POA: Diagnosis not present

## 2015-12-12 HISTORY — DX: Bell's palsy: G51.0

## 2015-12-12 HISTORY — DX: Other seasonal allergic rhinitis: J30.2

## 2015-12-12 HISTORY — DX: Reserved for inherently not codable concepts without codable children: IMO0001

## 2015-12-12 HISTORY — DX: Acute embolism and thrombosis of unspecified deep veins of unspecified lower extremity: I82.409

## 2015-12-12 HISTORY — DX: Diseases of the nervous system complicating pregnancy, third trimester: O99.353

## 2015-12-12 HISTORY — DX: Headache: R51

## 2015-12-12 LAB — URINALYSIS, ROUTINE W REFLEX MICROSCOPIC
BILIRUBIN URINE: NEGATIVE
GLUCOSE, UA: NEGATIVE mg/dL
HGB URINE DIPSTICK: NEGATIVE
Ketones, ur: NEGATIVE mg/dL
Leukocytes, UA: NEGATIVE
Nitrite: NEGATIVE
PROTEIN: NEGATIVE mg/dL
Specific Gravity, Urine: 1.024 (ref 1.005–1.030)
pH: 5.5 (ref 5.0–8.0)

## 2015-12-12 LAB — ABO/RH: ABO/RH(D): B POS

## 2015-12-12 LAB — COMPREHENSIVE METABOLIC PANEL
ALBUMIN: 4 g/dL (ref 3.5–5.0)
ALT: 23 U/L (ref 14–54)
ANION GAP: 6 (ref 5–15)
AST: 24 U/L (ref 15–41)
Alkaline Phosphatase: 57 U/L (ref 38–126)
BUN: 12 mg/dL (ref 6–20)
CALCIUM: 9.2 mg/dL (ref 8.9–10.3)
CHLORIDE: 106 mmol/L (ref 101–111)
CO2: 26 mmol/L (ref 22–32)
CREATININE: 0.65 mg/dL (ref 0.44–1.00)
Glucose, Bld: 92 mg/dL (ref 65–99)
Potassium: 3.6 mmol/L (ref 3.5–5.1)
SODIUM: 138 mmol/L (ref 135–145)
Total Bilirubin: 1.6 mg/dL — ABNORMAL HIGH (ref 0.3–1.2)
Total Protein: 7 g/dL (ref 6.5–8.1)

## 2015-12-12 LAB — CBC WITH DIFFERENTIAL/PLATELET
BASOS ABS: 0 10*3/uL (ref 0.0–0.1)
Basophils Relative: 0 %
Eosinophils Absolute: 0.3 10*3/uL (ref 0.0–0.7)
Eosinophils Relative: 3 %
HEMATOCRIT: 35.5 % — AB (ref 36.0–46.0)
HEMOGLOBIN: 11.1 g/dL — AB (ref 12.0–15.0)
LYMPHS PCT: 26 %
Lymphs Abs: 3 10*3/uL (ref 0.7–4.0)
MCH: 21.1 pg — ABNORMAL LOW (ref 26.0–34.0)
MCHC: 31.3 g/dL (ref 30.0–36.0)
MCV: 67.6 fL — ABNORMAL LOW (ref 78.0–100.0)
MONOS PCT: 6 %
Monocytes Absolute: 0.7 10*3/uL (ref 0.1–1.0)
NEUTROS PCT: 65 %
Neutro Abs: 7.4 10*3/uL (ref 1.7–7.7)
Platelets: 239 10*3/uL (ref 150–400)
RBC: 5.25 MIL/uL — AB (ref 3.87–5.11)
RDW: 15.4 % (ref 11.5–15.5)
WBC: 11.4 10*3/uL — AB (ref 4.0–10.5)

## 2015-12-12 LAB — SURGICAL PCR SCREEN
MRSA, PCR: NEGATIVE
STAPHYLOCOCCUS AUREUS: NEGATIVE

## 2015-12-12 LAB — HCG, SERUM, QUALITATIVE: Preg, Serum: NEGATIVE

## 2015-12-12 LAB — TYPE AND SCREEN
ABO/RH(D): B POS
ANTIBODY SCREEN: NEGATIVE

## 2015-12-12 LAB — APTT: APTT: 28 s (ref 24–37)

## 2015-12-12 LAB — PROTIME-INR
INR: 1.03 (ref 0.00–1.49)
Prothrombin Time: 13.7 seconds (ref 11.6–15.2)

## 2015-12-12 NOTE — Progress Notes (Signed)
Mrs Samantha Christensen denies chest pain or shortness of breath.  Patient experienced a DVT post operatively 2016, was on Eliquis October - January 2107.  Patient was followed by PCP, Dr Mickey FarberKristen Cox, [facility] [place], [place].   Mrs Samantha Christensen reports having history of Thalassemia Hemoglobin B, patient states that her hemoglobin runs around 11.

## 2015-12-22 ENCOUNTER — Encounter (HOSPITAL_COMMUNITY)
Admission: RE | Disposition: A | Discharge status: Home / Self Care | Payer: Self-pay | Source: Ambulatory Visit | Attending: Orthopedic Surgery

## 2015-12-22 ENCOUNTER — Inpatient Hospital Stay (HOSPITAL_COMMUNITY)
Admission: RE | Admit: 2015-12-22 | Discharge: 2015-12-23 | DRG: 470 | Disposition: A | Discharge status: Home / Self Care | Payer: BC Managed Care – PPO | Source: Ambulatory Visit | Attending: Orthopedic Surgery | Admitting: Orthopedic Surgery

## 2015-12-22 ENCOUNTER — Inpatient Hospital Stay (HOSPITAL_COMMUNITY): Payer: BC Managed Care – PPO | Admitting: Certified Registered Nurse Anesthetist

## 2015-12-22 DIAGNOSIS — Z86718 Personal history of other venous thrombosis and embolism: Secondary | ICD-10-CM

## 2015-12-22 DIAGNOSIS — Z87891 Personal history of nicotine dependence: Secondary | ICD-10-CM | POA: Diagnosis not present

## 2015-12-22 DIAGNOSIS — D569 Thalassemia, unspecified: Secondary | ICD-10-CM | POA: Diagnosis present

## 2015-12-22 DIAGNOSIS — M1712 Unilateral primary osteoarthritis, left knee: Secondary | ICD-10-CM | POA: Diagnosis present

## 2015-12-22 DIAGNOSIS — M1711 Unilateral primary osteoarthritis, right knee: Secondary | ICD-10-CM | POA: Diagnosis present

## 2015-12-22 DIAGNOSIS — M25562 Pain in left knee: Secondary | ICD-10-CM | POA: Diagnosis present

## 2015-12-22 HISTORY — PX: PARTIAL KNEE ARTHROPLASTY: SHX2174

## 2015-12-22 SURGERY — ARTHROPLASTY, KNEE, UNICOMPARTMENTAL
Anesthesia: Monitor Anesthesia Care | Laterality: Left

## 2015-12-22 MED ORDER — METHOCARBAMOL 500 MG PO TABS: 500.0000 mg | ORAL_TABLET | Freq: Three times a day (TID) | ORAL | 0 refills | Status: DC | PRN

## 2015-12-22 MED ORDER — ACETAMINOPHEN 650 MG RE SUPP: 650.0000 mg | Freq: Four times a day (QID) | RECTAL | Status: DC | PRN

## 2015-12-22 MED ORDER — MEPERIDINE HCL 25 MG/ML IJ SOLN: 6.2500 mg | INTRAMUSCULAR | Status: DC | PRN

## 2015-12-22 MED ORDER — MIDAZOLAM HCL 2 MG/2ML IJ SOLN
2.0000 mg | Freq: Once | INTRAMUSCULAR | Status: AC
  Administered 2015-12-22: 2 mg via INTRAVENOUS

## 2015-12-22 MED ORDER — PAROXETINE HCL 20 MG PO TABS
10.0000 mg | ORAL_TABLET | Freq: Every day | ORAL | Status: DC
  Administered 2015-12-23: 10 mg via ORAL
  Filled 2015-12-22: qty 1

## 2015-12-22 MED ORDER — POLYETHYLENE GLYCOL 3350 17 G PO PACK: 17.0000 g | PACK | Freq: Every day | ORAL | Status: DC | PRN

## 2015-12-22 MED ORDER — PROPOFOL 10 MG/ML IV BOLUS
INTRAVENOUS | Status: DC | PRN
  Administered 2015-12-22 (×2): 30 mg via INTRAVENOUS

## 2015-12-22 MED ORDER — CHLORHEXIDINE GLUCONATE 4 % EX LIQD: 60.0000 mL | Freq: Once | CUTANEOUS | Status: DC

## 2015-12-22 MED ORDER — BUPIVACAINE HCL (PF) 0.5 % IJ SOLN
INTRAMUSCULAR | Status: AC
  Filled 2015-12-22: qty 30

## 2015-12-22 MED ORDER — METHOCARBAMOL 1000 MG/10ML IJ SOLN
500.0000 mg | Freq: Four times a day (QID) | INTRAVENOUS | Status: DC | PRN
  Filled 2015-12-22: qty 5

## 2015-12-22 MED ORDER — ALUM & MAG HYDROXIDE-SIMETH 200-200-20 MG/5ML PO SUSP: 30.0000 mL | ORAL | Status: DC | PRN

## 2015-12-22 MED ORDER — CEFAZOLIN SODIUM-DEXTROSE 2-4 GM/100ML-% IV SOLN
2.0000 g | INTRAVENOUS | Status: AC
  Administered 2015-12-22: 2 g via INTRAVENOUS

## 2015-12-22 MED ORDER — CEFAZOLIN SODIUM-DEXTROSE 2-4 GM/100ML-% IV SOLN
2.0000 g | Freq: Four times a day (QID) | INTRAVENOUS | Status: AC
  Administered 2015-12-22 – 2015-12-23 (×2): 2 g via INTRAVENOUS
  Filled 2015-12-22 (×2): qty 100

## 2015-12-22 MED ORDER — OXYCODONE HCL 5 MG PO TABS
5.0000 mg | ORAL_TABLET | ORAL | Status: DC | PRN
  Administered 2015-12-22 – 2015-12-23 (×2): 10 mg via ORAL
  Filled 2015-12-22 (×3): qty 2

## 2015-12-22 MED ORDER — ONDANSETRON HCL 4 MG/2ML IJ SOLN
4.0000 mg | Freq: Four times a day (QID) | INTRAMUSCULAR | Status: DC | PRN
  Administered 2015-12-22: 4 mg via INTRAVENOUS
  Filled 2015-12-22: qty 2

## 2015-12-22 MED ORDER — BUPIVACAINE IN DEXTROSE 0.75-8.25 % IT SOLN
INTRATHECAL | Status: DC | PRN
  Administered 2015-12-22: 13.5 mg via INTRATHECAL

## 2015-12-22 MED ORDER — PROMETHAZINE HCL 25 MG/ML IJ SOLN: 6.2500 mg | INTRAMUSCULAR | Status: DC | PRN

## 2015-12-22 MED ORDER — BUPIVACAINE HCL (PF) 0.5 % IJ SOLN
INTRAMUSCULAR | Status: DC | PRN
Start: 2015-12-22 — End: 2015-12-22
  Administered 2015-12-22: 30 mL

## 2015-12-22 MED ORDER — 0.9 % SODIUM CHLORIDE (POUR BTL) OPTIME
TOPICAL | Status: DC | PRN
  Administered 2015-12-22: 1000 mL

## 2015-12-22 MED ORDER — SCOPOLAMINE 1 MG/3DAYS TD PT72
1.0000 | MEDICATED_PATCH | Freq: Once | TRANSDERMAL | Status: DC
  Administered 2015-12-22: 1.5 mg via TRANSDERMAL

## 2015-12-22 MED ORDER — MIDAZOLAM HCL 2 MG/2ML IJ SOLN: 0.5000 mg | Freq: Once | INTRAMUSCULAR | Status: DC | PRN

## 2015-12-22 MED ORDER — ONDANSETRON HCL 4 MG PO TABS: 4.0000 mg | ORAL_TABLET | Freq: Four times a day (QID) | ORAL | Status: DC | PRN

## 2015-12-22 MED ORDER — ONDANSETRON HCL 4 MG/2ML IJ SOLN
INTRAMUSCULAR | Status: DC | PRN
  Administered 2015-12-22: 4 mg via INTRAVENOUS

## 2015-12-22 MED ORDER — OXYCODONE-ACETAMINOPHEN 5-325 MG PO TABS: 1.0000 | ORAL_TABLET | ORAL | 0 refills | Status: DC | PRN

## 2015-12-22 MED ORDER — FENTANYL CITRATE (PF) 100 MCG/2ML IJ SOLN
100.0000 ug | Freq: Once | INTRAMUSCULAR | Status: AC
  Administered 2015-12-22: 100 ug via INTRAVENOUS

## 2015-12-22 MED ORDER — HYDROMORPHONE HCL 1 MG/ML IJ SOLN
0.2500 mg | INTRAMUSCULAR | Status: DC | PRN
  Administered 2015-12-22: 0.5 mg via INTRAVENOUS
  Filled 2015-12-22 (×2): qty 1

## 2015-12-22 MED ORDER — PHENYLEPHRINE HCL 10 MG/ML IJ SOLN
INTRAMUSCULAR | Status: DC | PRN
  Administered 2015-12-22: 120 ug via INTRAVENOUS
  Administered 2015-12-22 (×3): 80 ug via INTRAVENOUS

## 2015-12-22 MED ORDER — ASPIRIN EC 325 MG PO TBEC: 325.0000 mg | DELAYED_RELEASE_TABLET | Freq: Two times a day (BID) | ORAL | 0 refills | Status: DC

## 2015-12-22 MED ORDER — SODIUM CHLORIDE 0.9 % IV SOLN
INTRAVENOUS | Status: DC
  Administered 2015-12-22 (×2): via INTRAVENOUS

## 2015-12-22 MED ORDER — SCOPOLAMINE 1 MG/3DAYS TD PT72
MEDICATED_PATCH | TRANSDERMAL | Status: AC
  Administered 2015-12-22: 1.5 mg via TRANSDERMAL
  Filled 2015-12-22: qty 1

## 2015-12-22 MED ORDER — ACETAMINOPHEN 325 MG PO TABS: 650.0000 mg | ORAL_TABLET | Freq: Four times a day (QID) | ORAL | Status: DC | PRN

## 2015-12-22 MED ORDER — DEXAMETHASONE SODIUM PHOSPHATE 10 MG/ML IJ SOLN
10.0000 mg | Freq: Two times a day (BID) | INTRAMUSCULAR | Status: AC
  Administered 2015-12-22 – 2015-12-23 (×2): 10 mg via INTRAVENOUS
  Filled 2015-12-22 (×2): qty 1

## 2015-12-22 MED ORDER — CEFAZOLIN SODIUM-DEXTROSE 2-4 GM/100ML-% IV SOLN
INTRAVENOUS | Status: AC
  Filled 2015-12-22: qty 100

## 2015-12-22 MED ORDER — ZOLPIDEM TARTRATE 5 MG PO TABS: 5.0000 mg | ORAL_TABLET | Freq: Every evening | ORAL | Status: DC | PRN

## 2015-12-22 MED ORDER — BISACODYL 5 MG PO TBEC: 5.0000 mg | DELAYED_RELEASE_TABLET | Freq: Every day | ORAL | Status: DC | PRN

## 2015-12-22 MED ORDER — MIDAZOLAM HCL 2 MG/2ML IJ SOLN
INTRAMUSCULAR | Status: AC
  Filled 2015-12-22: qty 2

## 2015-12-22 MED ORDER — TRANEXAMIC ACID 1000 MG/10ML IV SOLN
1000.0000 mg | Freq: Once | INTRAVENOUS | Status: AC
  Administered 2015-12-22: 1000 mg via INTRAVENOUS
  Filled 2015-12-22: qty 10

## 2015-12-22 MED ORDER — KETOROLAC TROMETHAMINE 15 MG/ML IJ SOLN
15.0000 mg | Freq: Four times a day (QID) | INTRAMUSCULAR | Status: DC
  Administered 2015-12-23 (×2): 15 mg via INTRAVENOUS
  Filled 2015-12-22 (×3): qty 1

## 2015-12-22 MED ORDER — TRANEXAMIC ACID 1000 MG/10ML IV SOLN
1000.0000 mg | INTRAVENOUS | Status: AC
  Administered 2015-12-22: 1000 mg via INTRAVENOUS
  Filled 2015-12-22: qty 10

## 2015-12-22 MED ORDER — FENTANYL CITRATE (PF) 250 MCG/5ML IJ SOLN
INTRAMUSCULAR | Status: AC
  Filled 2015-12-22: qty 5

## 2015-12-22 MED ORDER — BUPIVACAINE LIPOSOME 1.3 % IJ SUSP
20.0000 mL | INTRAMUSCULAR | Status: AC
  Administered 2015-12-22: 20 mL
  Filled 2015-12-22: qty 20

## 2015-12-22 MED ORDER — DOCUSATE SODIUM 100 MG PO CAPS
100.0000 mg | ORAL_CAPSULE | Freq: Two times a day (BID) | ORAL | Status: DC
  Administered 2015-12-22 – 2015-12-23 (×2): 100 mg via ORAL
  Filled 2015-12-22 (×2): qty 1

## 2015-12-22 MED ORDER — HYDROMORPHONE HCL 1 MG/ML IJ SOLN
0.5000 mg | INTRAMUSCULAR | Status: DC | PRN
  Administered 2015-12-23: 1 mg via INTRAVENOUS
  Filled 2015-12-22: qty 1

## 2015-12-22 MED ORDER — LACTATED RINGERS IV SOLN
INTRAVENOUS | Status: DC
  Administered 2015-12-22 (×2): via INTRAVENOUS

## 2015-12-22 MED ORDER — MAGNESIUM CITRATE PO SOLN
1.0000 | Freq: Once | ORAL | Status: DC | PRN
Start: 2015-12-22 — End: 2015-12-23

## 2015-12-22 MED ORDER — BUPIVACAINE-EPINEPHRINE (PF) 0.5% -1:200000 IJ SOLN
INTRAMUSCULAR | Status: DC | PRN
  Administered 2015-12-22: 30 mL via PERINEURAL

## 2015-12-22 MED ORDER — ASPIRIN EC 325 MG PO TBEC
325.0000 mg | DELAYED_RELEASE_TABLET | Freq: Two times a day (BID) | ORAL | Status: DC
  Administered 2015-12-23: 325 mg via ORAL
  Filled 2015-12-22: qty 1

## 2015-12-22 MED ORDER — FENTANYL CITRATE (PF) 100 MCG/2ML IJ SOLN
INTRAMUSCULAR | Status: AC
  Filled 2015-12-22: qty 2

## 2015-12-22 MED ORDER — DIPHENHYDRAMINE HCL 12.5 MG/5ML PO ELIX: 12.5000 mg | ORAL_SOLUTION | ORAL | Status: DC | PRN

## 2015-12-22 MED ORDER — METHOCARBAMOL 500 MG PO TABS: 500.0000 mg | ORAL_TABLET | Freq: Four times a day (QID) | ORAL | Status: DC | PRN

## 2015-12-22 MED ORDER — PROPOFOL 500 MG/50ML IV EMUL
INTRAVENOUS | Status: DC | PRN
  Administered 2015-12-22: 80 ug/kg/min via INTRAVENOUS

## 2015-12-22 SURGICAL SUPPLY — 67 items
APL SKNCLS STERI-STRIP NONHPOA (GAUZE/BANDAGES/DRESSINGS) ×1
BANDAGE ESMARK 6X9 LF (GAUZE/BANDAGES/DRESSINGS) ×1 IMPLANT
BENZOIN TINCTURE PRP APPL 2/3 (GAUZE/BANDAGES/DRESSINGS) ×3 IMPLANT
BLADE SAGITTAL 25.0X1.19X90 (BLADE) ×2 IMPLANT
BLADE SAGITTAL 25.0X1.19X90MM (BLADE) ×1
BLADE SAW SAG 90X13X1.27 (BLADE) ×3 IMPLANT
BNDG CMPR 9X6 STRL LF SNTH (GAUZE/BANDAGES/DRESSINGS) ×1
BNDG ESMARK 6X9 LF (GAUZE/BANDAGES/DRESSINGS) ×3
BOWL SMART MIX CTS (DISPOSABLE) ×3 IMPLANT
CAPT KNEE PARTIAL 2 ×3 IMPLANT
CEMENT HV SMART SET (Cement) ×4 IMPLANT
CLOSURE WOUND 1/2 X4 (GAUZE/BANDAGES/DRESSINGS) ×2
COVER SURGICAL LIGHT HANDLE (MISCELLANEOUS) ×3 IMPLANT
CUFF TOURNIQUET SINGLE 34IN LL (TOURNIQUET CUFF) ×3 IMPLANT
CUFF TOURNIQUET SINGLE 44IN (TOURNIQUET CUFF) IMPLANT
DRAPE EXTREMITY T 121X128X90 (DRAPE) ×3 IMPLANT
DRAPE IMP U-DRAPE 54X76 (DRAPES) ×3 IMPLANT
DRAPE U-SHAPE 47X51 STRL (DRAPES) ×3 IMPLANT
DRSG AQUACEL AG ADV 3.5X10 (GAUZE/BANDAGES/DRESSINGS) ×2 IMPLANT
DRSG MEPILEX BORDER 4X12 (GAUZE/BANDAGES/DRESSINGS) ×1 IMPLANT
DRSG PAD ABDOMINAL 8X10 ST (GAUZE/BANDAGES/DRESSINGS) ×3 IMPLANT
DURAPREP 26ML APPLICATOR (WOUND CARE) ×3 IMPLANT
ELECT REM PT RETURN 9FT ADLT (ELECTROSURGICAL) ×3
ELECTRODE REM PT RTRN 9FT ADLT (ELECTROSURGICAL) ×1 IMPLANT
EVACUATOR 1/8 PVC DRAIN (DRAIN) ×3 IMPLANT
FACESHIELD WRAPAROUND (MASK) ×3 IMPLANT
FACESHIELD WRAPAROUND OR TEAM (MASK) ×1 IMPLANT
GAUZE SPONGE 4X4 12PLY STRL (GAUZE/BANDAGES/DRESSINGS) ×3 IMPLANT
GLOVE BIOGEL PI IND STRL 8 (GLOVE) ×2 IMPLANT
GLOVE BIOGEL PI INDICATOR 8 (GLOVE) ×4
GLOVE ECLIPSE 7.5 STRL STRAW (GLOVE) ×6 IMPLANT
GOWN STRL REUS W/ TWL LRG LVL3 (GOWN DISPOSABLE) ×1 IMPLANT
GOWN STRL REUS W/ TWL XL LVL3 (GOWN DISPOSABLE) ×2 IMPLANT
GOWN STRL REUS W/TWL LRG LVL3 (GOWN DISPOSABLE) ×3
GOWN STRL REUS W/TWL XL LVL3 (GOWN DISPOSABLE) ×6
HANDPIECE INTERPULSE COAX TIP (DISPOSABLE) ×3
HOOD PEEL AWAY FACE SHEILD DIS (HOOD) ×9 IMPLANT
IMMOBILIZER KNEE 20 (SOFTGOODS) IMPLANT
IMMOBILIZER KNEE 22 UNIV (SOFTGOODS) ×3 IMPLANT
KIT BASIN OR (CUSTOM PROCEDURE TRAY) ×3 IMPLANT
KIT ROOM TURNOVER OR (KITS) ×3 IMPLANT
KNEE CAPITATED PARTIAL 2 IMPLANT
MANIFOLD NEPTUNE II (INSTRUMENTS) ×3 IMPLANT
NDL SPNL 22GX3.5 QUINCKE BK (NEEDLE) ×1 IMPLANT
NEEDLE SPNL 22GX3.5 QUINCKE BK (NEEDLE) ×3 IMPLANT
NS IRRIG 1000ML POUR BTL (IV SOLUTION) ×3 IMPLANT
PACK BLADE SAW RECIP 70 3 PT (BLADE) ×2 IMPLANT
PACK TOTAL JOINT (CUSTOM PROCEDURE TRAY) ×3 IMPLANT
PACK UNIVERSAL I (CUSTOM PROCEDURE TRAY) ×1 IMPLANT
PAD ARMBOARD 7.5X6 YLW CONV (MISCELLANEOUS) ×6 IMPLANT
PAD CAST 4YDX4 CTTN HI CHSV (CAST SUPPLIES) ×1 IMPLANT
PADDING CAST COTTON 4X4 STRL (CAST SUPPLIES)
SET HNDPC FAN SPRY TIP SCT (DISPOSABLE) ×1 IMPLANT
STAPLER VISISTAT 35W (STAPLE) IMPLANT
STRIP CLOSURE SKIN 1/2X4 (GAUZE/BANDAGES/DRESSINGS) ×4 IMPLANT
SUCTION FRAZIER HANDLE 10FR (MISCELLANEOUS) ×2
SUCTION TUBE FRAZIER 10FR DISP (MISCELLANEOUS) ×1 IMPLANT
SUT MNCRL AB 3-0 PS2 18 (SUTURE) IMPLANT
SUT VIC AB 0 CTB1 27 (SUTURE) ×6 IMPLANT
SUT VIC AB 1 CT1 27 (SUTURE) ×6
SUT VIC AB 1 CT1 27XBRD ANBCTR (SUTURE) ×2 IMPLANT
SUT VIC AB 2-0 CTB1 (SUTURE) ×6 IMPLANT
SYR 50ML LL SCALE MARK (SYRINGE) ×3 IMPLANT
TOWEL OR 17X24 6PK STRL BLUE (TOWEL DISPOSABLE) ×3 IMPLANT
TOWEL OR 17X26 10 PK STRL BLUE (TOWEL DISPOSABLE) ×3 IMPLANT
TRAY FOLEY CATH 16FRSI W/METER (SET/KITS/TRAYS/PACK) IMPLANT
WRAP KNEE MAXI GEL POST OP (GAUZE/BANDAGES/DRESSINGS) ×3 IMPLANT

## 2015-12-22 NOTE — OR Nursing (Signed)
Pt denied any pain in PACU. While transporting pt to room pt started to c/o left groin pain and left knee pain. Pt BP when leaving PACU 116/68 HR-52 and SB during PACU in 50s. Pt received 0.5mg  Dilaudid when arriving to floor. Pt BP then dropped to 76/50, a bolus was administered pt was placed in trendelenburg and BP increased to 104/58 and remained in the 90s systolic. Jason Coop on call for Dr. Luiz Blare returned page and new orders to give another bolus to equal 1 liter of fluid and to continue to monitor BP. Report was given to nurse on floor. Pt stated that she is feeling better as well. PMS intact

## 2015-12-22 NOTE — Progress Notes (Signed)
Orthopedic Tech Progress Note Patient Details:  Samantha Christensen [birth year] [id number]  CPM Left Knee CPM Left Knee: On Left Knee Flexion (Degrees): 70 Left Knee Extension (Degrees): 0 Additional Comments: Trapeze bar and foot roll   Saul Fordyce 12/22/2015, 5:56 PM

## 2015-12-22 NOTE — Brief Op Note (Signed)
12/22/2015  5:19 PM  PATIENT:  Samantha Christensen  50 y.o. female  PRE-OPERATIVE DIAGNOSIS:  Osteoarthritis left knee  POST-OPERATIVE DIAGNOSIS:  Osteoarthritis left knee  PROCEDURE:  Procedure(s): UNICOMPARTMENTAL KNEE (Left)  SURGEON:  Surgeon(s) and Role:    * Jodi Geralds, MD - Primary  PHYSICIAN ASSISTANT:   ASSISTANTS: bethune   ANESTHESIA:   spinal  EBL:  Total I/O In: 1000 [I.V.:1000] Out: 100 [Blood:100]  BLOOD ADMINISTERED:none  DRAINS: ( ) Hemovact drain(s) in the l knee with  Suction Open   LOCAL MEDICATIONS USED:  MARCAINE    and OTHER experel  SPECIMEN:  No Specimen  DISPOSITION OF SPECIMEN:  N/A  COUNTS:  YES  TOURNIQUET:   Total Tourniquet Time Documented: Thigh (Left) - 789 minutes Total: Thigh (Left) - 789 minutes   DICTATION: .Other Dictation: Dictation Number [id number]  PLAN OF CARE: Admit to inpatient   PATIENT DISPOSITION:  PACU - hemodynamically stable.   Delay start of Pharmacological VTE agent (>24hrs) due to surgical blood loss or risk of bleeding: no

## 2015-12-22 NOTE — Anesthesia Preprocedure Evaluation (Addendum)
Anesthesia Evaluation  Patient identified by MRN, date of birth, ID band Patient awake    Reviewed: Allergy & Precautions, NPO status , Patient's Chart, lab work & pertinent test results  History of Anesthesia Complications (+) PONV and history of anesthetic complications  Airway Mallampati: I  TM Distance: >3 FB Neck ROM: Full    Dental  (+) Dental Advisory Given   Pulmonary neg pulmonary ROS, former smoker (quit 1993),    breath sounds clear to auscultation       Cardiovascular + DVT (with prior knee surgery)  (-) CAD  Rhythm:Regular Rate:Normal     Neuro/Psych  Headaches, Depression    GI/Hepatic negative GI ROS, Neg liver ROS,   Endo/Other  Morbid obesity  Renal/GU negative Renal ROS     Musculoskeletal  (+) Arthritis , Osteoarthritis,    Abdominal (+) + obese,   Peds  Hematology  (+) Blood dyscrasia (Thalassemia, Hb 11.1), ,   Anesthesia Other Findings   Reproductive/Obstetrics                            Anesthesia Physical Anesthesia Plan  ASA: II  Anesthesia Plan: Spinal   Post-op Pain Management:  Regional for Post-op pain   Induction:   Airway Management Planned: Natural Airway and Simple Face Mask  Additional Equipment:   Intra-op Plan:   Post-operative Plan:   Informed Consent: I have reviewed the patients History and Physical, chart, labs and discussed the procedure including the risks, benefits and alternatives for the proposed anesthesia with the patient or authorized representative who has indicated his/her understanding and acceptance.   Dental advisory given  Plan Discussed with: CRNA and Surgeon  Anesthesia Plan Comments: (Plan routine monitors, SAB, adductor canal block for post op analgesia)       Anesthesia Quick Evaluation

## 2015-12-22 NOTE — H&P (Signed)
TOTAL KNEE ADMISSION H&P  Patient is being admitted for left Unicompartmental knee arthroplasty.  Subjective:  Chief Complaint:left knee pain.  HPI: Samantha Christensen, 50 y.o. female, has a history of pain and functional disability in the left knee due to arthritis and has failed non-surgical conservative treatments for greater than 12 weeks to includeNSAID's and/or analgesics, corticosteriod injections, viscosupplementation injections, flexibility and strengthening excercises, supervised PT with diminished ADL's post treatment, use of assistive devices, weight reduction as appropriate and activity modification.  Onset of symptoms was gradual, starting 5 years ago with gradually worsening course since that time. The patient noted prior procedures on the knee to include  arthroscopy and menisectomy on the left knee(s).  Patient currently rates pain in the left knee(s) at 8 out of 10 with activity. Patient has night pain, worsening of pain with activity and weight bearing, pain that interferes with activities of daily living, pain with passive range of motion and joint swelling.  Patient has evidence of subchondral cysts and joint space narrowing by imaging studies. This patient has had failure of all reasonable conservative care. There is no active infection.  There are no active problems to display for this patient.  Past Medical History:  Diagnosis Date  . Acute DVT (deep venous thrombosis) ([place]) 03/2015   Left Leg after knee replacement  . Bell's palsy affecting pregnancy in third trimester 1997   left  . DJD (degenerative joint disease)   . Headache 2004   severe head ache - left no cause found- no problem since then  . Nasal fracture    broke it when she broke her radius, stitches under nose.  Marland Kitchen PONV (postoperative nausea and vomiting)    No problem if she has the patch  . Seasonal allergies   . Shortness of breath dyspnea    with exertion  . Thalassemia- hemoglobin I disease ([place]) 1985     Past Surgical History:  Procedure Laterality Date  . CERVICAL SPINE SURGERY     fusion  . CESAREAN SECTION     x2  . CHOLECYSTECTOMY    . DILATION AND CURETTAGE OF UTERUS    . ENDOMETRIAL ABLATION    . KNEE ARTHROSCOPY  2016  . OPEN REDUCTION INTERNAL FIXATION (ORIF) DISTAL RADIAL FRACTURE Right 03/10/2015   Procedure: OPEN TREATMENT OF RIGHT DISTAL RADIUS FRACTURE;  Surgeon: Milly Jakob, MD;  Location: [facility];  Service: Orthopedics;  Laterality: Right;  . TONSILLECTOMY      No prescriptions prior to admission.   Allergies  Allergen Reactions  . No Known Allergies     Social History  Substance Use Topics  . Smoking status: Former Smoker    Years: 4.00    Quit date: 06/01/1991  . Smokeless tobacco: Never Used  . Alcohol use No    No family history on file.   ROS ROS: I have reviewed the patient's review of systems thoroughly and there are no positive responses as relates to the HPI. Objective:  Physical Exam Well-developed well-nourished patient in no acute distress. Alert and oriented x3 HEENT:within normal limits Cardiac: Regular rate and rhythm Pulmonary: Lungs clear to auscultation Abdomen: Soft and nontender.  Normal active bowel sounds  Musculoskeletal: (Left knee:Knee is tender palpation over the medial joint line.  There's pain through range of motion.  There's no instability.Vital signs in last 24 hours:    Labs: Recent Results (from the past 2160 hour(s))  Surgical pcr screen     Status: None  Collection Time: 12/12/15 12:04 PM  Result Value Ref Range   MRSA, PCR NEGATIVE NEGATIVE   Staphylococcus aureus NEGATIVE NEGATIVE    Comment:        The Xpert SA Assay (FDA approved for NASAL specimens in patients over 84 years of age), is one component of a comprehensive surveillance program.  Test performance has been validated by [facility] for patients greater than or equal to 68 year old. It is not intended to diagnose  infection nor to guide or monitor treatment.   Urinalysis, Routine w reflex microscopic (not at [facility])     Status: None   Collection Time: 12/12/15 12:04 PM  Result Value Ref Range   Color, Urine YELLOW YELLOW   APPearance CLEAR CLEAR   Specific Gravity, Urine 1.024 1.005 - 1.030   pH 5.5 5.0 - 8.0   Glucose, UA NEGATIVE NEGATIVE mg/dL   Hgb urine dipstick NEGATIVE NEGATIVE   Bilirubin Urine NEGATIVE NEGATIVE   Ketones, ur NEGATIVE NEGATIVE mg/dL   Protein, ur NEGATIVE NEGATIVE mg/dL   Nitrite NEGATIVE NEGATIVE   Leukocytes, UA NEGATIVE NEGATIVE    Comment: MICROSCOPIC NOT DONE ON URINES WITH NEGATIVE PROTEIN, BLOOD, LEUKOCYTES, NITRITE, OR GLUCOSE <1000 mg/dL.  hCG, serum, qualitative     Status: None   Collection Time: 12/12/15 12:05 PM  Result Value Ref Range   Preg, Serum NEGATIVE NEGATIVE    Comment:        THE SENSITIVITY OF THIS METHODOLOGY IS >10 mIU/mL.   APTT     Status: None   Collection Time: 12/12/15 12:05 PM  Result Value Ref Range   aPTT 28 24 - 37 seconds  CBC WITH DIFFERENTIAL     Status: Abnormal   Collection Time: 12/12/15 12:05 PM  Result Value Ref Range   WBC 11.4 (H) 4.0 - 10.5 K/uL   RBC 5.25 (H) 3.87 - 5.11 MIL/uL   Hemoglobin 11.1 (L) 12.0 - 15.0 g/dL   HCT 35.5 (L) 36.0 - 46.0 %   MCV 67.6 (L) 78.0 - 100.0 fL   MCH 21.1 (L) 26.0 - 34.0 pg   MCHC 31.3 30.0 - 36.0 g/dL   RDW 15.4 11.5 - 15.5 %   Platelets 239 150 - 400 K/uL   Neutrophils Relative % 65 %   Lymphocytes Relative 26 %   Monocytes Relative 6 %   Eosinophils Relative 3 %   Basophils Relative 0 %   Neutro Abs 7.4 1.7 - 7.7 K/uL   Lymphs Abs 3.0 0.7 - 4.0 K/uL   Monocytes Absolute 0.7 0.1 - 1.0 K/uL   Eosinophils Absolute 0.3 0.0 - 0.7 K/uL   Basophils Absolute 0.0 0.0 - 0.1 K/uL   Smear Review MORPHOLOGY UNREMARKABLE   Comprehensive metabolic panel     Status: Abnormal   Collection Time: 12/12/15 12:05 PM  Result Value Ref Range   Sodium 138 135 - 145 mmol/L   Potassium 3.6 3.5  - 5.1 mmol/L   Chloride 106 101 - 111 mmol/L   CO2 26 22 - 32 mmol/L   Glucose, Bld 92 65 - 99 mg/dL   BUN 12 6 - 20 mg/dL   Creatinine, Ser 0.65 0.44 - 1.00 mg/dL   Calcium 9.2 8.9 - 10.3 mg/dL   Total Protein 7.0 6.5 - 8.1 g/dL   Albumin 4.0 3.5 - 5.0 g/dL   AST 24 15 - 41 U/L   ALT 23 14 - 54 U/L   Alkaline Phosphatase 57 38 - 126 U/L  Total Bilirubin 1.6 (H) 0.3 - 1.2 mg/dL   GFR calc non Af Amer >60 >60 mL/min   GFR calc Af Amer >60 >60 mL/min    Comment: (NOTE) The eGFR has been calculated using the CKD EPI equation. This calculation has not been validated in all clinical situations. eGFR's persistently <60 mL/min signify possible Chronic Kidney Disease.    Anion gap 6 5 - 15  Protime-INR     Status: None   Collection Time: 12/12/15 12:05 PM  Result Value Ref Range   Prothrombin Time 13.7 11.6 - 15.2 seconds   INR 1.03 0.00 - 1.49  Type and screen Order type and screen if day of surgery is less than 15 days from draw of preadmission visit or order morning of surgery if day of surgery is greater than 6 days from preadmission visit.     Status: None   Collection Time: 12/12/15 12:15 PM  Result Value Ref Range   ABO/RH(D) B POS    Antibody Screen NEG    Sample Expiration 12/26/2015    Extend sample reason NO TRANSFUSIONS OR PREGNANCY IN THE PAST 3 MONTHS   ABO/Rh     Status: None   Collection Time: 12/12/15 12:15 PM  Result Value Ref Range   ABO/RH(D) B POS     Estimated body mass index is 33.23 kg/m as calculated from the following:   Height as of 12/12/15: _0  (1.626 m).   Weight as of 12/12/15: 87.8 kg (193 lb 9.6 oz).   Imaging Review Plain radiographs demonstrate severe degenerative joint disease of the left knee(s). The overall alignment ismild varus. The bone quality appears to be good for age and reported activity level.  Assessment/Plan:  End stage arthritis, left knee   The patient history, physical examination, clinical judgment of the provider and  imaging studies are consistent with end stage degenerative joint disease of the left knee(s) and Unicompartmentalknee arthroplasty is deemed medically necessary. The treatment options including medical management, injection therapy arthroscopy and arthroplasty were discussed at length. The risks and benefits of total knee arthroplasty were presented and reviewed. The risks due to aseptic loosening, infection, stiffness, patella tracking problems, thromboembolic complications and other imponderables were discussed. The patient acknowledged the explanation, agreed to proceed with the plan and consent was signed. Patient is being admitted for inpatient treatment for surgery, pain control, PT, OT, prophylactic antibiotics, VTE prophylaxis, progressive ambulation and ADL's and discharge planning. The patient is planning to be discharged home with home health services

## 2015-12-22 NOTE — Anesthesia Procedure Notes (Signed)
Spinal  Patient location during procedure: OR End time: 12/22/2015 3:09 PM Staffing Anesthesiologist: Jairo Ben Performed: anesthesiologist  Preanesthetic Checklist Completed: patient identified, site marked, surgical consent, pre-op evaluation, timeout performed, IV checked, risks and benefits discussed and monitors and equipment checked Spinal Block Patient position: sitting Prep: Betadine, ChloraPrep and site prepped and draped Patient monitoring: heart rate, continuous pulse ox and blood pressure Approach: midline Location: L3-4 Injection technique: single-shot Needle Needle type: Quincke  Needle gauge: 25 G Needle length: 9 cm Additional Notes Pt identified in Operating room.  Monitors applied. Working IV access confirmed. Sterile prep, drape lumbar spine.  1% lido local L 3,4.  #25ga Quincke into clear CSF L 3,4.  13.5mg  0.75% Bupivacaine with dextrose injected with asp CSF beginning and end of injection.  Patient asymptomatic, VSS, no heme aspirated, tolerated well.  Sandford Craze, MD

## 2015-12-22 NOTE — Discharge Instructions (Signed)

## 2015-12-22 NOTE — Transfer of Care (Signed)
Immediate Anesthesia Transfer of Care Note  Patient: Samantha Christensen  Procedure(s) Performed: Procedure(s): UNICOMPARTMENTAL KNEE (Left)  Patient Location: PACU  Anesthesia Type:MAC, Regional and Spinal  Level of Consciousness: awake, alert , oriented and patient cooperative  Airway & Oxygen Therapy: Patient Spontanous Breathing and Patient connected to nasal cannula oxygen  Post-op Assessment: Report given to RN and Post -op Vital signs reviewed and stable  Post vital signs: Reviewed and stable  Last Vitals:  Vitals:   12/22/15 1450 12/22/15 1455  BP: 137/76 135/64  Pulse: 82 90  Resp: 13 13  Temp:      Last Pain:  Vitals:   12/22/15 1239  TempSrc: Oral         Complications: No apparent anesthesia complications

## 2015-12-22 NOTE — Anesthesia Postprocedure Evaluation (Signed)
Anesthesia Post Note  Patient: Samantha Christensen  Procedure(s) Performed: Procedure(s) (LRB): UNICOMPARTMENTAL KNEE (Left)  Anesthesia Post Evaluation  Last Vitals:  Vitals:   12/22/15 1455 12/22/15 1730  BP: 135/64   Pulse: 90   Resp: 13   Temp:  (P) 36.4 C    Last Pain:  Vitals:   12/22/15 1239  TempSrc: Oral                 Angelie Kram, Grier Rocher

## 2015-12-22 NOTE — Anesthesia Procedure Notes (Signed)
Anesthesia Regional Block:  Adductor canal block  Pre-Anesthetic Checklist: ,, timeout performed, Correct Patient, Correct Site, Correct Laterality, Correct Procedure, Correct Position, site marked, Risks and benefits discussed,  Surgical consent,  Pre-op evaluation,  At surgeon's request and post-op pain management  Laterality: Left and Lower  Prep: chloraprep       Needles:   Needle Type: Echogenic Needle     Needle Length: 9cm 9 cm Needle Gauge: 22 and 22 G    Additional Needles:  Procedures: ultrasound guided (picture in chart) Adductor canal block Narrative:  Start time: 12/22/2015 2:45 PM End time: 12/22/2015 2:50 PM Injection made incrementally with aspirations every 5 mL.  Performed by: Personally  Anesthesiologist: Jean Rosenthal, Narek Kniss  Additional Notes: Pt identified in Holding room.  Monitors applied. Working IV access confirmed. Sterile prep, drape L thigh.  #22ga ECHOgenic needle into adductor canal with US guidance  30cc 0.5% Bupivacaine with 1:200k epi injected incrementally after negative test dose.  Patient asymptomatic, VSS, no heme aspirated, tolerated well.

## 2015-12-23 LAB — CBC
HCT: 31.8 % — ABNORMAL LOW (ref 36.0–46.0)
HEMOGLOBIN: 10 g/dL — AB (ref 12.0–15.0)
MCH: 20.8 pg — ABNORMAL LOW (ref 26.0–34.0)
MCHC: 31.4 g/dL (ref 30.0–36.0)
MCV: 66.1 fL — ABNORMAL LOW (ref 78.0–100.0)
Platelets: 262 10*3/uL (ref 150–400)
RBC: 4.81 MIL/uL (ref 3.87–5.11)
RDW: 15.2 % (ref 11.5–15.5)
WBC: 13.6 10*3/uL — AB (ref 4.0–10.5)

## 2015-12-23 NOTE — Discharge Summary (Signed)
Patient ID: Samantha Christensen MRN: [id number] DOB/AGE: 1966-01-22 50 y.o.  Admit date: 12/22/2015 Discharge date: 12/23/2015  Admission Diagnoses:  Principal Problem:   Primary osteoarthritis of left knee Active Problems:   Primary osteoarthritis of right knee   Discharge Diagnoses:  Same  Past Medical History:  Diagnosis Date  . Acute DVT (deep venous thrombosis) (HCC) 03/2015   Left Leg after knee replacement  . Bell's palsy affecting pregnancy in third trimester 1997   left  . DJD (degenerative joint disease)   . Headache 2004   severe head ache - left no cause found- no problem since then  . Nasal fracture    broke it when she broke her radius, stitches under nose.  Samantha Christensen PONV (postoperative nausea and vomiting)    No problem if she has the patch  . Seasonal allergies   . Shortness of breath dyspnea    with exertion  . Thalassemia- hemoglobin I disease (HCC) 1985    Surgeries: Procedure(s):left UNICOMPARTMENTAL KNEE on 12/22/2015   Discharged Condition: Improved  Hospital Course: Samantha Christensen is an 50 y.o. female who was admitted 12/22/2015 for operative treatment ofPrimary osteoarthritis of left knee. Patient has severe unremitting pain that affects sleep, daily activities, and work/hobbies. After pre-op clearance the patient was taken to the operating room on 12/22/2015 and underwent  Procedure(s):left UNICOMPARTMENTAL KNEE.    Patient was given perioperative antibiotics: Anti-infectives    Start     Dose/Rate Route Frequency Ordered Stop   12/22/15 2130  ceFAZolin (ANCEF) IVPB 2g/100 mL premix     2 g 200 mL/hr over 30 Minutes Intravenous Every 6 hours 12/22/15 2107 12/23/15 0656   12/22/15 1415  ceFAZolin (ANCEF) IVPB 2g/100 mL premix     2 g 200 mL/hr over 30 Minutes Intravenous On call to O.R. 12/22/15 1234 12/22/15 1502   12/22/15 1245  ceFAZolin (ANCEF) 2-4 GM/100ML-% IVPB    Comments:  Rogelia Mire   : cabinet override      12/22/15 1245 12/23/15 0059        Patient was given sequential compression devices, early ambulation, and chemoprophylaxis to prevent DVT.  Patient benefited maximally from hospital stay and there were no complications.    Recent vital signs: Patient Vitals for the past 24 hrs:  BP Temp Temp src Pulse Resp SpO2 Height Weight  12/23/15 0433 123/67 98.5 F (36.9 C) Oral 88 18 99 % - -  12/23/15 0008 (!) 152/83 98.2 F (36.8 C) Oral 91 17 95 % - -  12/22/15 2040 (!) 106/53 98.1 F (36.7 C) Oral (!) 50 18 100 % - -  12/22/15 1730 - 97.6 F (36.4 C) - - - - - -  12/22/15 1455 135/64 - - 90 13 95 % - -  12/22/15 1450 137/76 - - 82 13 98 % - -  12/22/15 1445 (!) 151/74 - - 94 14 100 % - -  12/22/15 1239 (!) 169/61 98.3 F (36.8 C) Oral 68 18 96 % 5\' 4"  (1.626 m) 87.8 kg (193 lb 9.6 oz)     Recent laboratory studies: No results for input(s): WBC, HGB, HCT, PLT, NA, K, CL, CO2, BUN, CREATININE, GLUCOSE, INR, CALCIUM in the last 72 hours.  Invalid input(s): PT, 2   Discharge Medications:     Medication List    TAKE these medications   aspirin EC 325 MG tablet Take 1 tablet (325 mg total) by mouth 2 (two) times daily after a meal. Take x 1  month post op to decrease risk of blood clots.   glucosamine-chondroitin 500-400 MG tablet Take 2 tablets by mouth every morning.   loratadine 10 MG tablet Commonly known as:  CLARITIN Take 10 mg by mouth daily.   methocarbamol 500 MG tablet Commonly known as:  ROBAXIN Take 1 tablet (500 mg total) by mouth every 8 (eight) hours as needed for muscle spasms.   oxyCODONE-acetaminophen 5-325 MG tablet Commonly known as:  PERCOCET/ROXICET Take 1-2 tablets by mouth every 4 (four) hours as needed for severe pain.   PARoxetine 10 MG tablet Commonly known as:  PAXIL Take 10 mg by mouth daily.       Diagnostic Studies: Dg Chest 2 View  Result Date: 12/12/2015 CLINICAL DATA:  Productive cough for 3 weeks. EXAM: CHEST  2 VIEW COMPARISON:  Radiographs of December 09, 2008.  FINDINGS: The heart size and mediastinal contours are within normal limits. Both lungs are clear. No pneumothorax or pleural effusion is noted. The visualized skeletal structures are unremarkable. IMPRESSION: No active cardiopulmonary disease. Electronically Signed   By: Lupita Raider, M.D.   On: 12/12/2015 12:58    Disposition: 01-Home or Self Care  Discharge Instructions    Call MD / Call 911    Complete by:  As directed   If you experience chest pain or shortness of breath, CALL 911 and be transported to the hospital emergency room.  If you develope a fever above 101 F, pus (white drainage) or increased drainage or redness at the wound, or calf pain, call your surgeon's office.   Constipation Prevention    Complete by:  As directed   Drink plenty of fluids.  Prune juice may be helpful.  You may use a stool softener, such as Colace (over the counter) 100 mg twice a day.  Use MiraLax (over the counter) for constipation as needed.   Diet general    Complete by:  As directed   Do not put a pillow under the knee. Place it under the heel.    Complete by:  As directed   Follow the hip precautions as taught in Physical Therapy    Complete by:  As directed   Increase activity slowly as tolerated    Complete by:  As directed   Weight bearing as tolerated    Complete by:  As directed   Laterality:  left   Extremity:  Lower   Weight bearing as tolerated    Complete by:  As directed   Laterality:  left   Extremity:  Lower      Follow-up Information    Christensen,Samantha L, MD. Schedule an appointment as soon as possible for a visit in 2 weeks.   Specialty:  Orthopedic Surgery Contact information: [address] [place] [address] [phone]            Signed: Matthew Folks 12/23/2015, 9:58 AM

## 2015-12-23 NOTE — Op Note (Signed)
NAME:  Samantha Christensen, Samantha Christensen NO.:  [id number]  MEDICAL RECORD NO.:  [id number]  LOCATION:  [id number]                        FACILITY:  [facility]  PHYSICIAN:  Harvie Junior, M.D.   DATE OF BIRTH:  February 13, 1966  DATE OF PROCEDURE:  12/22/2015 DATE OF DISCHARGE:                              OPERATIVE REPORT   PREOPERATIVE DIAGNOSIS:  End-stage degenerative joint disease, medial compartment only, left knee.  POSTOPERATIVE DIAGNOSIS:  End-stage degenerative joint disease, medial compartment only, left knee.  PROCEDURE:  Left unicompartmental knee replacement with an Oxford system size small femur, size B tibia, size 5 mobile-bearing insert.  SURGEON:  Harvie Junior, M.D.  ASSISTANT:  Marshia Ly, P.A.  ANESTHESIA:  Spinal.  BRIEF HISTORY:  Samantha Christensen is a 50 year old female with a long history of significant complaints of left knee pain.  She has had arthroscopic intervention, which showed that she had grade 4 change on the medial femoral condyle and medial tibial plateau.  She had a meniscectomy performed.  She did poorly postsurgery, continued to have significant and ongoing pain and after failure of conservative care, we evaluated her and felt that she was a candidate for Universe total.  We had a long conversation with her because of her young age, active lifestyle and no signs of lateral patellofemoral disease.  We felt that unicompartmental knee replacement was appropriate.  She understood that she could have a short life span with this and then it could not need to be revised and that she could have other complications, but she understood them and decided to have unicompartment knee replacement, and brought to the operating room for this procedure.  DESCRIPTION OF PROCEDURE:  The patient was brought to the operating room.  After adequate anesthesia was obtained with general anesthetic, the patient was placed supine on the operating table.  The right knee was  then prepped and draped in usual sterile fashion.  Following this, the leg was exsanguinated, blood pressure tourniquet inflated to 300 mmHg.  Following this, an incision was made just medial to midline subcutaneous tissue down the level of the extensor mechanism and a medial parapatellar arthrotomy was undertaken.  Synovectomy was performed.  Attention was turned to the medial side where osteophytes were removed and the femur was sized to a small.  The cut for the tibia block was then used and once it was in appropriate position, we locked it to the size small femoral component and the tibia was then cut and the wafer removed and measured on the back table to a size B for the tibia.  Once this was done, attention was turned towards the femur where an intramedullary pilot hole was drilled and once this was done, we put the 7-degree alignment guide, attached it to the jig and once this was done, the central portion of the femur was drilled with the two drills. The femoral block was then put in place and the posterior femur was resected at that point.  Once that was done, 0 spigot was used, the trial femur was placed and the trial tibial component.  We get the 5 in, it was little tighter than I wanted to be  in flexion, 4 was too loose, it was sorter between 4 and 5.  I went up into extension, it was between 1 and 0.  So, we went ahead and just did 3 and then trialed it, again too tight, went to 4 and trialed it again and now it was little tightest in extension, still flexion was good.  I really decided at this point where I could 5 in flexion.  At that point, once that was done, we put the spigot it and did in to 5 and now she had perfect balance in flexion and extension.  We went ahead at that point and did the final milling of the femur and did some supplementary cement holes and then we did the final milling of the tibia with toothbrush saw including this trough out.  Once this was done,  the knee was copiously and thoroughly lavaged, pulsatile lavage, irrigation and suctioned dry and then, the tibial component was then put in place, femoral component was cemented into place and all this was held with the 5 paddle in place.  Once the 5 paddle in place had been chosen and we held that to 45 degrees until the cement was completely hardened, but all excess bone cement was removed at this time, and once that was done, we then trialed a 5 and actually got very nice range of motion, but that little bit tightest and absolutely full extension, but actually I thought that was pretty good. The 4 was too loose in flexion and so we went with the 5.  At that point, put the final 5 into place, small oozy with tourniquet let down, not significant and we had irrigated it thoroughly.  We went ahead and put a medium Hemovac drain in her and then closed the medial parapatellar arthrotomy with 1 Vicryl running, skin with 0 and 2-0 Vicryl and 3-0 Monocryl subcuticular.  Benzoin and Steri-Strips were applied.  Sterile compressive dressing was applied.  The patient was taken to the recovery, she was noted to be in satisfactory condition. Estimated blood loss for the procedure was minimal.  A 20 mL of Exparel and 20 mL Marcaine mix were instilled throughout the knee for postoperative anesthesia.  The patient was taken to the recovery room, she was noted to be in satisfactory condition.     Harvie Junior, M.D.     Ranae Plumber  D:  12/22/2015  T:  12/23/2015  Job:  [id number]

## 2015-12-23 NOTE — Evaluation (Signed)
Physical Therapy Evaluation Patient Details Name: Samantha Christensen MRN: [id number] DOB: August 01, 1965 Today's Date: 12/23/2015   History of Present Illness  Patient is a 50 yo female s/p Unicompartmentalknee arthroplasty   Clinical Impression  Patient demonstrates deficits in functional mobility as indicated below. Will benefit from continued skilled PT to address deficits and maximize function. Will see as indicated and progress as tolerated. Recommend outpatient physical therapy upon acute discharge.    Follow Up Recommendations Outpatient PT    Equipment Recommendations  Other (comment) (patient has equipment)    Recommendations for Other Services       Precautions / Restrictions Precautions Precautions: Knee Precaution Comments: verbally reviewed Restrictions Weight Bearing Restrictions: Yes LLE Weight Bearing: Weight bearing as tolerated      Mobility  Bed Mobility Overal bed mobility: Modified Independent             General bed mobility comments: increased time to perform, no cues or assist required  Transfers Overall transfer level: Needs assistance Equipment used: Rolling walker (2 wheeled) Transfers: Sit to/from Stand Sit to Stand: Supervision         General transfer comment: VCs for hand placement and technique, no physical assist required  Ambulation/Gait Ambulation/Gait assistance: Supervision Ambulation Distance (Feet): 210 Feet Assistive device: Rolling walker (2 wheeled) Gait Pattern/deviations: Step-through pattern;Decreased stride length;Antalgic Gait velocity: decreased Gait velocity interpretation: Below normal speed for age/gender General Gait Details: VCs for step through gait, cues for increased cadence. No physical assist  Stairs Stairs: Yes Stairs assistance: Supervision Stair Management: One rail Right Number of Stairs: 3 General stair comments: VCs for sequencing, no physical assist, good technique  Wheelchair Mobility     Modified Rankin (Stroke Patients Only)       Balance Overall balance assessment: No apparent balance deficits (not formally assessed)                                           Pertinent Vitals/Pain Pain Assessment: 0-10 Pain Score: 5  Pain Location: knee Pain Descriptors / Indicators: Sore Pain Intervention(s): Monitored during session    Home Living Family/patient expects to be discharged to:: Private residence Living Arrangements: Spouse/significant other Available Help at Discharge: Family Type of Home: House Home Access: Stairs to enter Entrance Stairs-Rails: None Secretary/administrator of Steps: 1 Home Layout: One level Home Equipment: None      Prior Function Level of Independence: Independent               Hand Dominance   Dominant Hand: Right    Extremity/Trunk Assessment   Upper Extremity Assessment: Overall WFL for tasks assessed           Lower Extremity Assessment: LLE deficits/detail   LLE Deficits / Details: limited ROM s/p knee surgery     Communication   Communication: No difficulties  Cognition Arousal/Alertness: Awake/alert Behavior During Therapy: WFL for tasks assessed/performed Overall Cognitive Status: Within Functional Limits for tasks assessed                      General Comments      Exercises        Assessment/Plan    PT Assessment Patient needs continued PT services  PT Diagnosis Difficulty walking;Abnormality of gait;Acute pain   PT Problem List Decreased strength;Decreased activity tolerance;Decreased balance;Decreased mobility;Decreased knowledge of use of DME;Pain  PT Treatment Interventions  DME instruction;Gait training;Stair training;Functional mobility training;Therapeutic activities;Therapeutic exercise;Balance training;Patient/family education   PT Goals (Current goals can be found in the Care Plan section) Acute Rehab PT Goals Patient Stated Goal: to go home PT Goal  Formulation: With patient/family Time For Goal Achievement: 16-Jan-2016 Potential to Achieve Goals: Good    Frequency 7X/week   Barriers to discharge        Co-evaluation               End of Session Equipment Utilized During Treatment: Gait belt Activity Tolerance: Patient tolerated treatment well Patient left: in bed;with call bell/phone within reach;with family/visitor present Nurse Communication: Mobility status         Time: 0981-1914 PT Time Calculation (min) (ACUTE ONLY): 20 min   Charges:   PT Evaluation $PT Eval Moderate Complexity: 1 Procedure     PT G CodesFabio Asa 01-16-2016, 9:56 AM  Charlotte Crumb, PT DPT  [phone]

## 2015-12-23 NOTE — Progress Notes (Addendum)
Reviewed discharge instructions with patient, patient understood medications and instructions well. Prescriptions reviewed and understood  Asyah Candler A Psychologist, counselling, RN

## 2015-12-23 NOTE — Progress Notes (Signed)
Orthopedic Tech Progress Note Patient Details:  Samantha Christensen 1966/03/03 [id number]  Patient ID: Samantha Christensen, female   DOB: November 08, 1965, 50 y.o.   MRN: [id number] Applied cpm 0-70  [name] 12/23/2015, 6:02 AM

## 2015-12-23 NOTE — Progress Notes (Addendum)
Subjective: 1 Day Post-Op Procedure(s) (LRB): UNICOMPARTMENTAL KNEE (Left) Patient reports pain as mild.taking by mouth and voiding okay.   Objective: Vital signs in last 24 hours: Temp:  [97.6 F (36.4 C)-98.5 F (36.9 C)] 98.5 F (36.9 C) (07/25 0433) Pulse Rate:  [50-94] 88 (07/25 0433) Resp:  [13-18] 18 (07/25 0433) BP: (106-169)/(53-83) 123/67 (07/25 0433) SpO2:  [95 %-100 %] 99 % (07/25 0433) Weight:  [87.8 kg (193 lb 9.6 oz)] 87.8 kg (193 lb 9.6 oz) (07/24 1239)  Intake/Output from previous day: 07/24 0701 - 07/25 0700 In: 1200 [I.V.:1200] Out: 150 [Drains:50; Blood:100] Intake/Output this shift: Total I/O In: 120 [P.O.:120] Out: -   No results for input(s): HGB in the last 72 hours. No results for input(s): WBC, RBC, HCT, PLT in the last 72 hours. No results for input(s): NA, K, CL, CO2, BUN, CREATININE, GLUCOSE, CALCIUM in the last 72 hours. No results for input(s): LABPT, INR in the last 72 hours. Left knee exam: Hemovac drain intact. Dressing clean and dry.  Calf soft.  NV intact distally.  Range of motion 0 to 90.   Assessment/Plan: 1 Day Post-Op Procedure(s) (LRB): UNICOMPARTMENTAL KNEE (Left)  Plan: Weight-bear as tolerated on left. Hemovac drain pulled. Okay to discharge home today after physical therapy. Aspirin 325 mg twice daily for DVT prophylaxis 1 month. Follow-up Dr. Luiz Blare in 2 weeks.  Tamas Suen G 12/23/2015, 9:51 AM

## 2015-12-23 NOTE — Progress Notes (Signed)
Physical Therapy Treatment Patient Details Name: Samantha Christensen MRN: [id number] DOB: [birth year] Today's Date: 12/23/2015    History of Present Illness Patient is a 50 yo female s/p Unicompartmentalknee arthroplasty     PT Comments    Patient is making good progress with PT.  From a mobility standpoint anticipate patient will be ready for DC home when medically ready.     Follow Up Recommendations  Outpatient PT     Equipment Recommendations  Other (comment) (patient has equipment)    Recommendations for Other Services       Precautions / Restrictions Precautions Precautions: Knee Precaution Comments: verbally reviewed Restrictions Weight Bearing Restrictions: Yes LLE Weight Bearing: Weight bearing as tolerated    Mobility  Bed Mobility Overal bed mobility: Independent                Transfers Overall transfer level: Needs assistance Equipment used: Rolling walker (2 wheeled) Transfers: Sit to/from Stand Sit to Stand: Supervision         General transfer comment: cues for hand placement  Ambulation/Gait Ambulation/Gait assistance: Supervision Ambulation Distance (Feet): 225 Feet Assistive device: Rolling walker (2 wheeled) Gait Pattern/deviations: Step-through pattern Gait velocity: decreased   General Gait Details: cues for knee extension during stance phase; pt able to WB on L LE without reliance on RW to offload   Stairs            Wheelchair Mobility    Modified Rankin (Stroke Patients Only)       Balance Overall balance assessment: No apparent balance deficits (not formally assessed)                                  Cognition Arousal/Alertness: Awake/alert Behavior During Therapy: WFL for tasks assessed/performed Overall Cognitive Status: Within Functional Limits for tasks assessed                      Exercises Total Joint Exercises Quad Sets: AROM;Both;15 reps;Supine Heel Slides: AROM;Left;15  reps;Supine Hip ABduction/ADduction: AROM;Left;15 reps;Supine Straight Leg Raises: AROM;Left;10 reps;Supine Long Arc Quad: AROM;Left;15 reps;Seated Knee Flexion: AROM;Left;10 reps;Seated Goniometric ROM: 0-95    General Comments        Pertinent Vitals/Pain Pain Assessment: 0-10 Pain Score: 3  Pain Location: L Knee Pain Descriptors / Indicators: Sore Pain Intervention(s): Monitored during session;Premedicated before session;Repositioned    Home Living                      Prior Function            PT Goals (current goals can now be found in the care plan section) Acute Rehab PT Goals Patient Stated Goal: get back to work in mid August PT Goal Formulation: With patient/family Time For Goal Achievement: 12/23/15 Potential to Achieve Goals: Good Progress towards PT goals: Progressing toward goals    Frequency  7X/week    PT Plan Current plan remains appropriate    Co-evaluation             End of Session Equipment Utilized During Treatment: Gait belt Activity Tolerance: Patient tolerated treatment well Patient left: in bed;with call bell/phone within reach;with family/visitor present     Time: 1308-6578 PT Time Calculation (min) (ACUTE ONLY): 28 min  Charges:  $Gait Training: 8-22 mins $Therapeutic Exercise: 8-22 mins  G Codes:      Derek Mound, PTA Pager: [phone]   12/23/2015, 3:38 PM

## 2015-12-26 ENCOUNTER — Encounter (HOSPITAL_COMMUNITY): Payer: Self-pay | Admitting: Orthopedic Surgery

## 2016-09-28 DIAGNOSIS — Z86718 Personal history of other venous thrombosis and embolism: Secondary | ICD-10-CM | POA: Insufficient documentation

## 2016-12-22 LAB — HM COLONOSCOPY

## 2017-11-05 IMAGING — CR DG CHEST 2V
2 series · 2 of 2 positions shown · non-contrast
Comparison: Radiographs December 09, 2008.

CLINICAL DATA: Productive cough for 3 weeks.

EXAM:
CHEST  2 VIEW

[w chest pa]
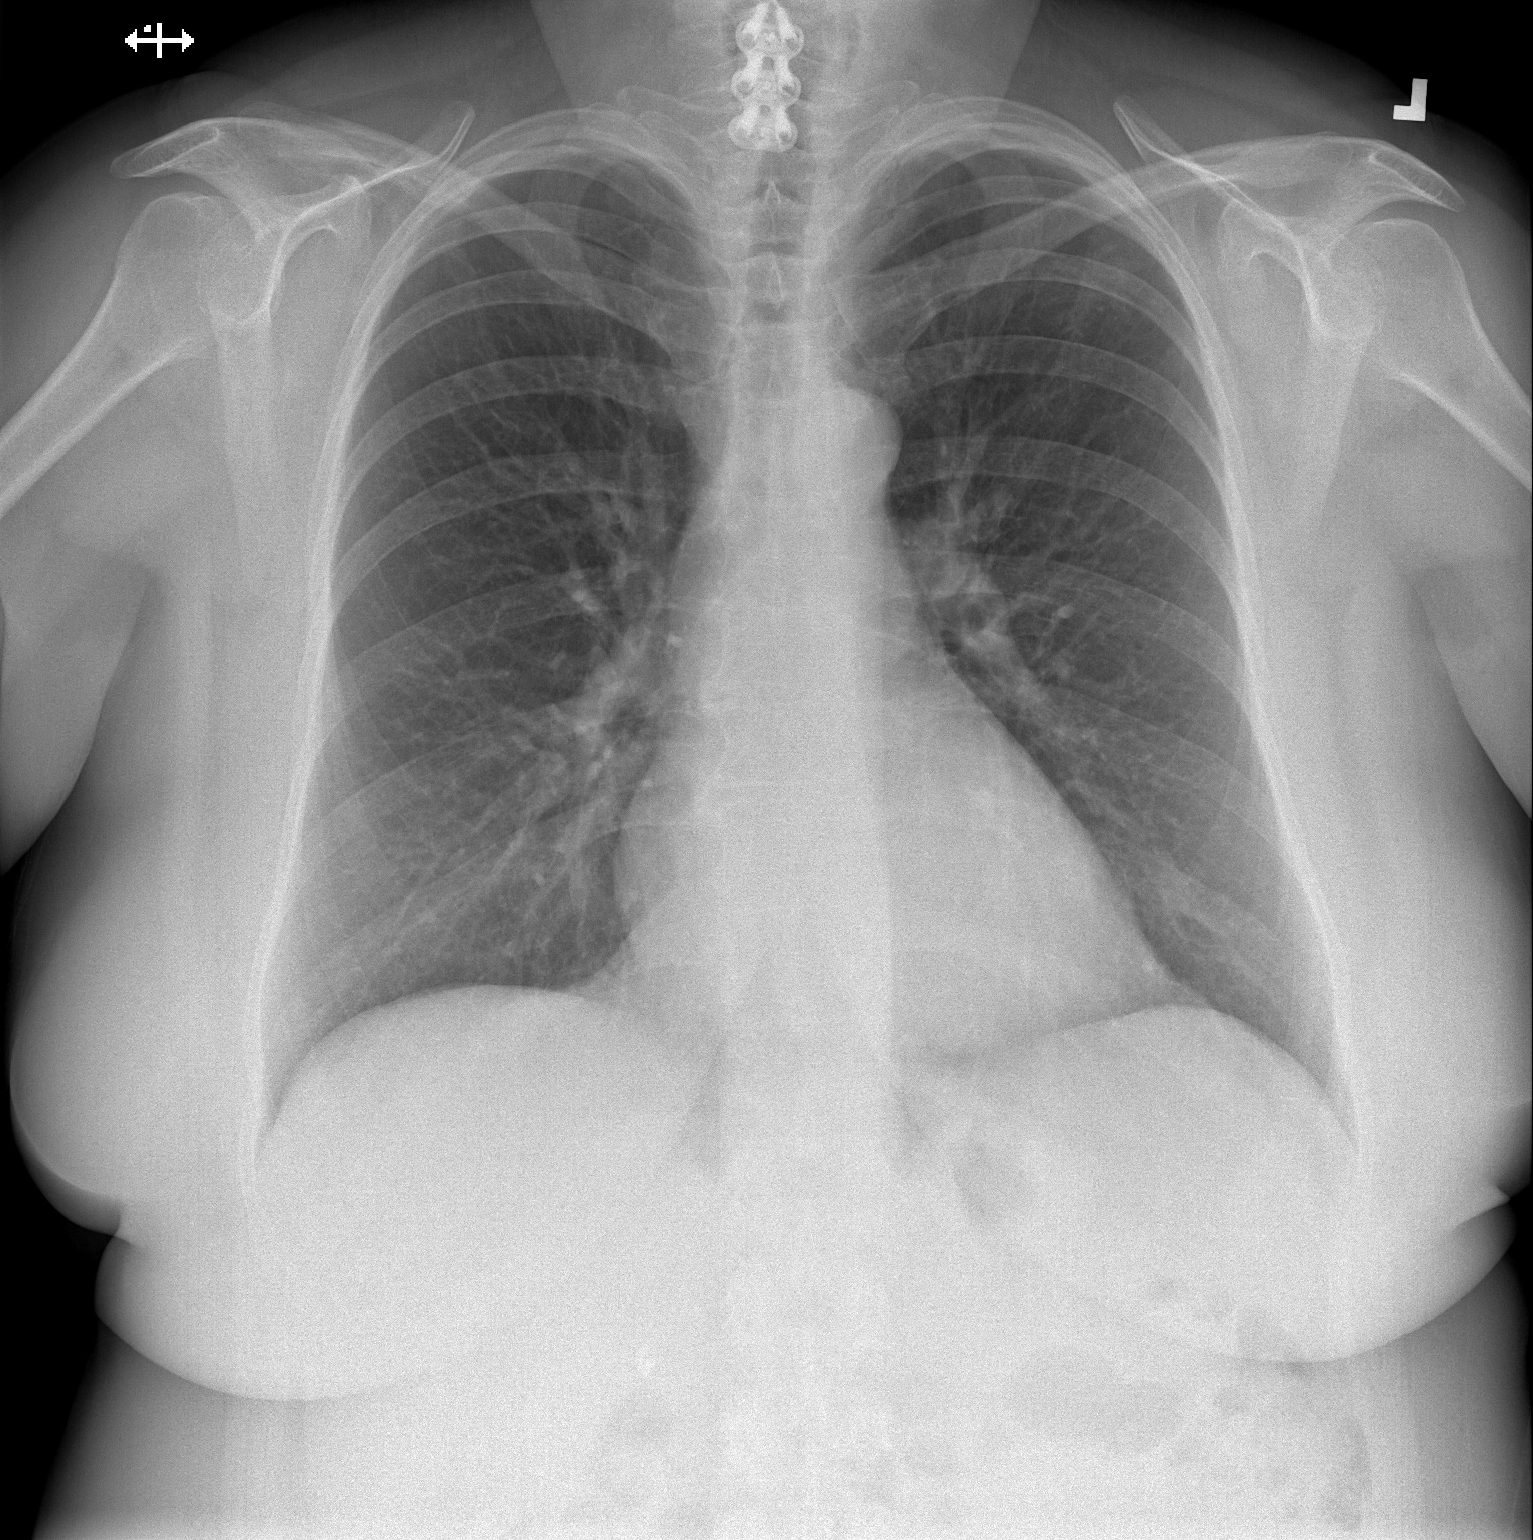

[w chest lat]
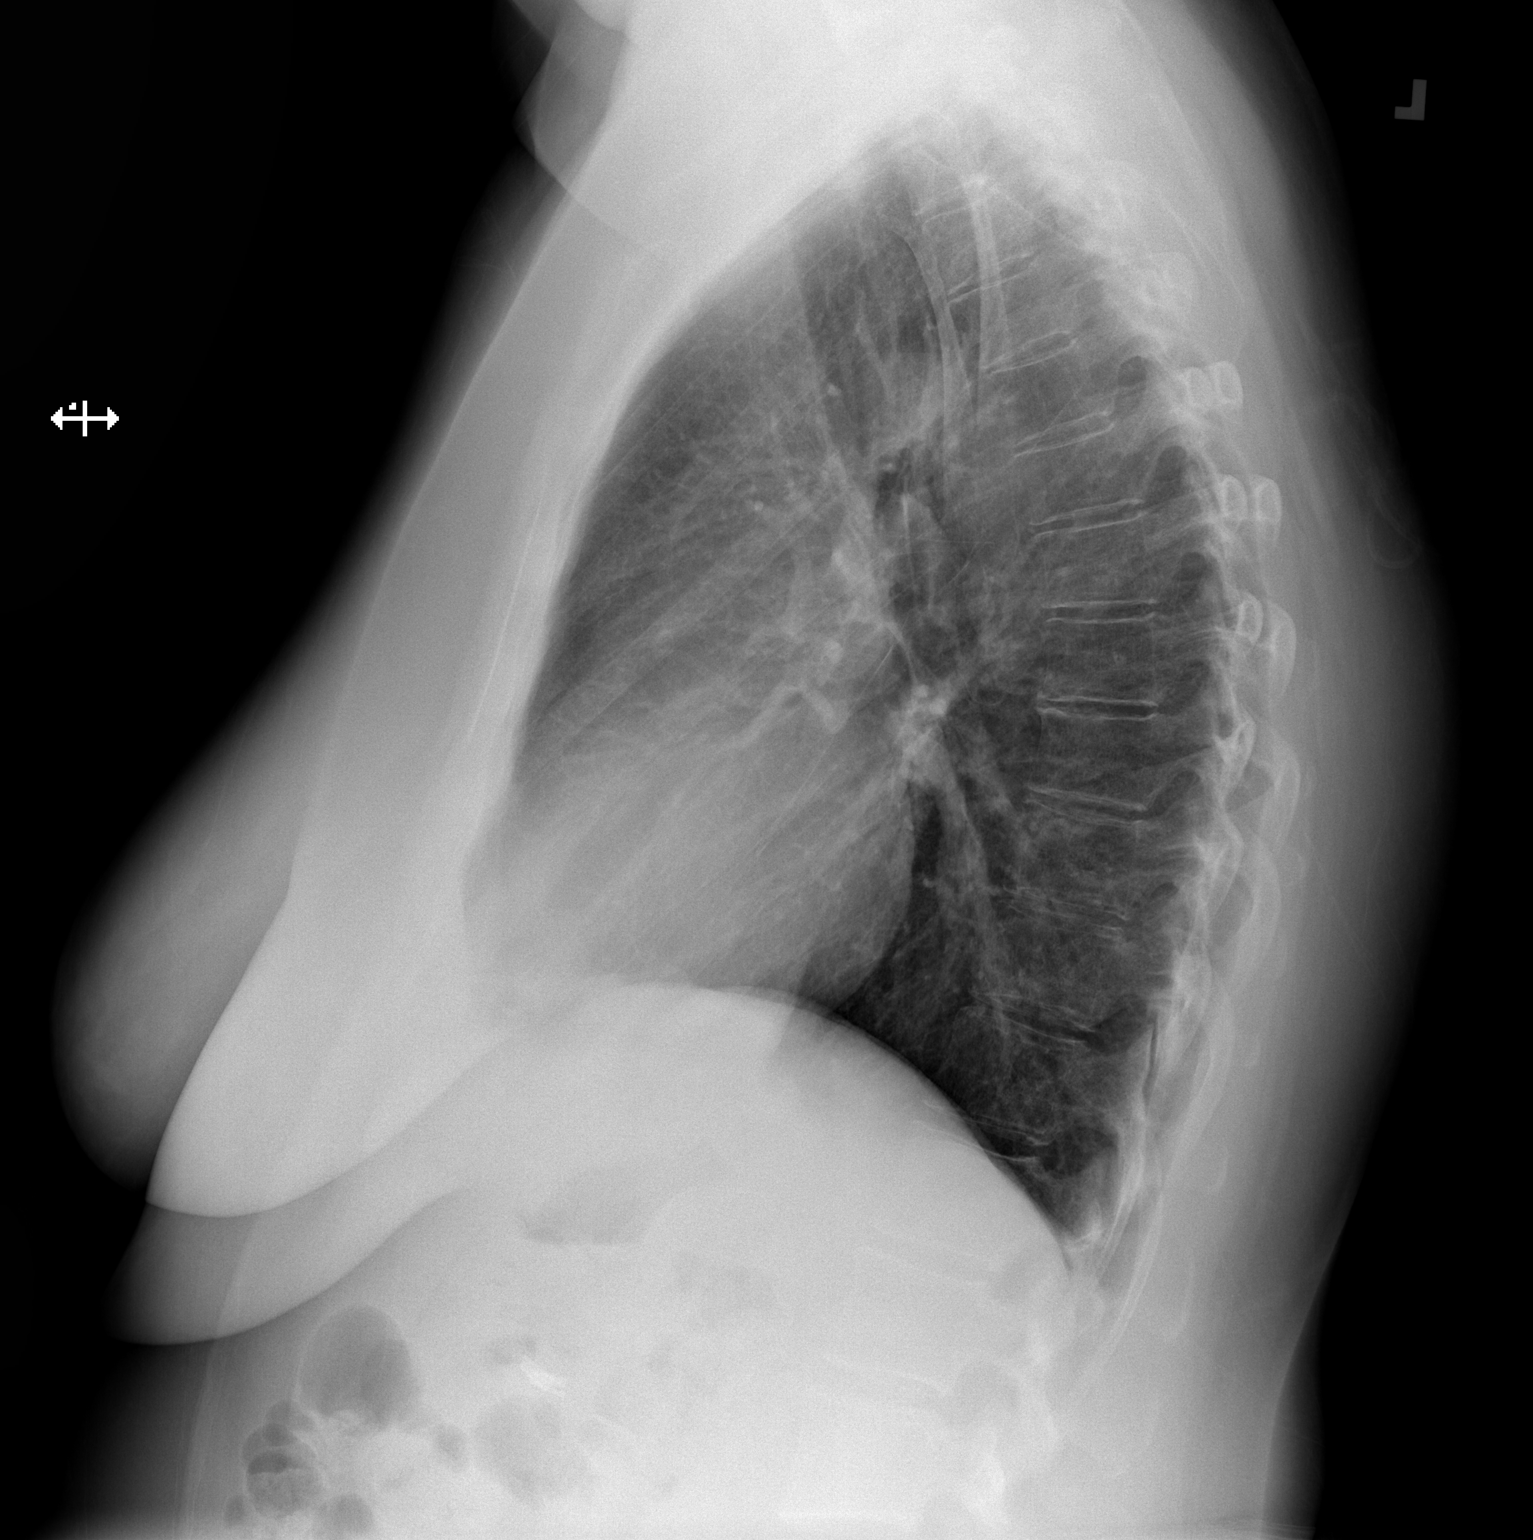

[2 of 2 positions shown; findings below may reference images not displayed]

FINDINGS: The heart size and mediastinal contours are within normal limits.
Both lungs are clear. No pneumothorax or pleural effusion is noted.
The visualized skeletal structures are unremarkable.
IMPRESSION: No active cardiopulmonary disease.

## 2018-10-13 ENCOUNTER — Other Ambulatory Visit: Payer: Self-pay | Admitting: Orthopedic Surgery

## 2018-10-13 DIAGNOSIS — M25561 Pain in right knee: Secondary | ICD-10-CM

## 2018-10-25 ENCOUNTER — Other Ambulatory Visit: Payer: Self-pay

## 2018-10-25 ENCOUNTER — Ambulatory Visit
Admission: RE | Admit: 2018-10-25 | Discharge: 2018-10-25 | Disposition: A | Discharge status: Home / Self Care | Payer: BC Managed Care – PPO | Source: Ambulatory Visit | Attending: Orthopedic Surgery | Admitting: Orthopedic Surgery

## 2018-10-25 DIAGNOSIS — M25561 Pain in right knee: Secondary | ICD-10-CM

## 2019-08-06 ENCOUNTER — Encounter: Payer: Self-pay | Admitting: Family Medicine

## 2019-08-06 ENCOUNTER — Telehealth (INDEPENDENT_AMBULATORY_CARE_PROVIDER_SITE_OTHER): Payer: BC Managed Care – PPO | Admitting: Physician Assistant

## 2019-08-06 DIAGNOSIS — J060 Acute laryngopharyngitis: Secondary | ICD-10-CM

## 2019-08-06 DIAGNOSIS — Z20828 Contact with and (suspected) exposure to other viral communicable diseases: Secondary | ICD-10-CM

## 2019-08-06 MED ORDER — CEFDINIR 300 MG PO CAPS: 300.0000 mg | ORAL_CAPSULE | Freq: Two times a day (BID) | ORAL | 0 refills | Status: DC

## 2019-08-06 NOTE — Assessment & Plan Note (Signed)
Recommend quarantine until results back and treat symptoms COVID test pending

## 2019-08-06 NOTE — Progress Notes (Signed)
Virtual Visit via Telephone Note   This visit type was conducted due to national recommendations for restrictions regarding the COVID-19 Pandemic (e.g. social distancing) in an effort to limit this patient's exposure and mitigate transmission in our community.  Due to her co-morbid illnesses, this patient is at least at moderate risk for complications without adequate follow up.  This format is felt to be most appropriate for this patient at this time.  The patient did not have access to video technology/had technical difficulties with video requiring transitioning to audio format only (telephone).  All issues noted in this document were discussed and addressed.  No physical exam could be performed with this format.  Patient verbally consented to a telehealth visit.   Date:  08/06/2019   ID:  Samantha Christensen, DOB [birth year], MRN [id number]  Patient Location: Home Provider Location: Office  PCP:  Rochel Brome, MD     Chief Complaint:  Samantha Christensen  History of Present Illness:    Samantha Christensen is a 54 y.o. female with uri symptoms  The patient does have symptoms concerning for COVID-19 infection (fever, chills, cough, or new shortness of breath).   Upper Respiratory Infection: Patient complains of symptoms of a URI. Symptoms include right ear stopped up, sinus drainage, congestion, and sore throat. Onset of symptoms was 3 day ago, stable since that time.denies fever - however has had significant fatigue   Past Medical History:  Diagnosis Date  . Acute DVT (deep venous thrombosis) ([place]) 03/2015   Left Leg after knee replacement  . Bell's palsy affecting pregnancy in third trimester 1997   left  . DJD (degenerative joint disease)   . GERD (gastroesophageal reflux disease)   . Headache 2004   severe head ache - left no cause found- no problem since then  . Hypertension   . Nasal fracture    broke it when she broke her radius, stitches under nose.  Marland Kitchen PONV (postoperative nausea and vomiting)     No problem if she has the patch  . Seasonal allergies   . Shortness of breath dyspnea    with exertion  . Thalassemia- hemoglobin I disease 1985   Past Surgical History:  Procedure Laterality Date  . CERVICAL SPINE SURGERY     fusion  . CESAREAN SECTION     x2  . CHOLECYSTECTOMY    . DILATION AND CURETTAGE OF UTERUS    . ENDOMETRIAL ABLATION    . KNEE ARTHROSCOPY  2016  . OPEN REDUCTION INTERNAL FIXATION (ORIF) DISTAL RADIAL FRACTURE Right 03/10/2015   Procedure: OPEN TREATMENT OF RIGHT DISTAL RADIUS FRACTURE;  Surgeon: Milly Jakob, MD;  Location: [place];  Service: Orthopedics;  Laterality: Right;  . PARTIAL KNEE ARTHROPLASTY Left 12/22/2015   Procedure: UNICOMPARTMENTAL KNEE;  Surgeon: Dorna Leitz, MD;  Location: [place];  Service: Orthopedics;  Laterality: Left;  . TONSILLECTOMY       Current Meds  Medication Sig  . methocarbamol (ROBAXIN) 500 MG tablet Take 1 tablet (500 mg total) by mouth every 8 (eight) hours as needed for muscle spasms.     Allergies:   No known allergies   Social History   Tobacco Use  . Smoking status: Former Smoker    Years: 4.00    Quit date: 06/01/1991    Years since quitting: 28.2  . Smokeless tobacco: Never Used  Substance Use Topics  . Alcohol use: No  . Drug use: No     Family Hx: The patient's  family history includes Cancer in her father, mother, and sister; Diabetes in her father and paternal grandmother; Heart failure in her maternal grandfather and maternal grandmother; Hypertension in her father and paternal grandfather; Kidney Stones in her mother; Stroke in her father and paternal grandfather; Thalassemia in her maternal grandmother and mother; Thyroid disease in her mother.  ROS:   Please see the history of present illness.    All other systems reviewed and are negative.  Labs/Other Tests and Data Reviewed:    Recent Labs: No results found for requested labs within last 8760 hours.   Recent Lipid  Panel No results found for: CHOL, TRIG, HDL, CHOLHDL, LDLCALC, LDLDIRECT  Wt Readings from Last 3 Encounters:  12/22/15 193 lb 9.6 oz (87.8 kg)  12/12/15 193 lb 9.6 oz (87.8 kg)  03/10/15 187 lb (84.8 kg)     Objective:    Vital Signs:  There were no vitals taken for this visit.     ASSESSMENT & PLAN:    1. Uri 2. COVID symptoms  COVID-19 Education: The signs and symptoms of COVID-19 were discussed with the patient and how to seek care for testing (follow up with PCP or arrange E-visit). The importance of social distancing was discussed today.  Time:   Today, I have spent 10 minutes with the patient with telehealth technology discussing the above problems.     Medication Adjustments/Labs and Tests Ordered: Current medicines are reviewed at length with the patient today.  Concerns regarding medicines are outlined above.   Tests Ordered: Orders Placed This Encounter  Procedures  . Novel Coronavirus, NAA ([facility])    Medication Changes: Meds ordered this encounter  Medications  . cefdinir (OMNICEF) 300 MG capsule    Sig: Take 1 capsule (300 mg total) by mouth 2 (two) times daily.    Dispense:  20 capsule    Refill:  0    Order Specific Question:   Supervising Provider    AnswerCorey Harold    Follow Up:  In Person prn  Signed, Vickey Sages, PA-C  08/06/2019 2:01 PM    [facility] [employer]

## 2019-08-06 NOTE — Assessment & Plan Note (Signed)
Continue claritin and singulair as directed rx sent for Omnicef 300mg  bid

## 2019-08-07 LAB — NOVEL CORONAVIRUS, NAA: SARS-CoV-2, NAA: NOT DETECTED

## 2019-10-01 ENCOUNTER — Other Ambulatory Visit: Payer: Self-pay | Admitting: Physician Assistant

## 2019-10-01 MED ORDER — MONTELUKAST SODIUM 10 MG PO TABS: 10.0000 mg | ORAL_TABLET | Freq: Every day | ORAL | 3 refills | Status: DC

## 2019-10-15 ENCOUNTER — Ambulatory Visit (HOSPITAL_COMMUNITY)
Admission: RE | Admit: 2019-10-15 | Discharge: 2019-10-15 | Disposition: A | Discharge status: Home / Self Care | Payer: BC Managed Care – PPO | Source: Ambulatory Visit | Attending: Family Medicine | Admitting: Family Medicine

## 2019-10-15 ENCOUNTER — Other Ambulatory Visit: Payer: Self-pay

## 2019-10-15 ENCOUNTER — Other Ambulatory Visit (HOSPITAL_COMMUNITY): Payer: Self-pay | Admitting: Family Medicine

## 2019-10-15 DIAGNOSIS — M7989 Other specified soft tissue disorders: Secondary | ICD-10-CM | POA: Diagnosis present

## 2019-10-15 DIAGNOSIS — M79605 Pain in left leg: Secondary | ICD-10-CM | POA: Diagnosis present

## 2019-10-15 NOTE — Progress Notes (Signed)
Lower extremity venous has been completed.   Preliminary results in CV Proc.   Blanch Media 10/15/2019 1:31 PM

## 2019-11-13 ENCOUNTER — Other Ambulatory Visit: Payer: Self-pay

## 2019-11-13 MED ORDER — LISINOPRIL 5 MG PO TABS: 5.0000 mg | ORAL_TABLET | Freq: Every day | ORAL | 0 refills | Status: DC

## 2020-02-07 ENCOUNTER — Other Ambulatory Visit: Payer: Self-pay | Admitting: Physician Assistant

## 2020-02-08 ENCOUNTER — Encounter: Payer: Self-pay | Admitting: Physician Assistant

## 2020-02-08 ENCOUNTER — Ambulatory Visit: Payer: BC Managed Care – PPO | Admitting: Physician Assistant

## 2020-02-08 ENCOUNTER — Other Ambulatory Visit: Payer: Self-pay

## 2020-02-08 VITALS — BP 144/82 | HR 114 | Temp 98.1°F | Ht 64.0 in | Wt 210.4 lb

## 2020-02-08 DIAGNOSIS — I10 Essential (primary) hypertension: Secondary | ICD-10-CM | POA: Diagnosis not present

## 2020-02-08 DIAGNOSIS — Z23 Encounter for immunization: Secondary | ICD-10-CM

## 2020-02-08 MED ORDER — LISINOPRIL 5 MG PO TABS: 5.0000 mg | ORAL_TABLET | Freq: Every day | ORAL | 1 refills | Status: DC

## 2020-02-08 MED ORDER — MELOXICAM 15 MG PO TABS: 15.0000 mg | ORAL_TABLET | Freq: Every day | ORAL | 1 refills | Status: DC

## 2020-02-08 NOTE — Progress Notes (Addendum)
Established Patient Office Visit  Subjective:  Patient ID: Samantha Christensen, female    DOB: [birth year]  Age: 54 y.o. MRN: [id number]  CC:  Chief Complaint  Patient presents with  . Hypertension    HPI ARIANNY PUN presents for follow up hypertension    Pt presents for follow up of hypertension.  Date of diagnosis 07/2013.  Her current cardiac medication regimen includes an ACE inhibitor ( Lisinopril 5 mg QD ).    She is tolerating the medication well without side effects.  Compliance with treatment has been good; she takes her medication as directed, maintains her diet and exercise regimen, and follows up as directed.      Dx with gastro-esophageal reflux disease without esophagitis; the location of the discomfort is primarily epigastric.    Symptoms are improved with a prescription-strength H-2 blocker ( Pepcid ).  No risk factors for ulcer disease are identified.  Past medical history is pertinent for gallstones ( S/P laparoscopic cholecystectomy ).    Pt has history of chronic knee pain - had surgery a few months ago and doing well but would like refill of mobic to have on hand  Past Medical History:  Diagnosis Date  . Acute DVT (deep venous thrombosis) ([facility]) 03/2015   Left Leg after knee replacement  . Bell's palsy affecting pregnancy in third trimester 1997   left  . DJD (degenerative joint disease)   . GERD (gastroesophageal reflux disease)   . Headache 2004   severe head ache - left no cause found- no problem since then  . Hypertension   . Nasal fracture    broke it when she broke her radius, stitches under nose.  Marland Kitchen PONV (postoperative nausea and vomiting)    No problem if she has the patch  . Seasonal allergies   . Shortness of breath dyspnea    with exertion  . Thalassemia- hemoglobin I disease 1985    Past Surgical History:  Procedure Laterality Date  . CERVICAL SPINE SURGERY     fusion  . CESAREAN SECTION     x2  . CHOLECYSTECTOMY    . DILATION AND  CURETTAGE OF UTERUS    . ENDOMETRIAL ABLATION    . KNEE ARTHROSCOPY  2016  . OPEN REDUCTION INTERNAL FIXATION (ORIF) DISTAL RADIAL FRACTURE Right 03/10/2015   Procedure: OPEN TREATMENT OF RIGHT DISTAL RADIUS FRACTURE;  Surgeon: Mack Hook, MD;  Location: [facility];  Service: Orthopedics;  Laterality: Right;  . PARTIAL KNEE ARTHROPLASTY Left 12/22/2015   Procedure: UNICOMPARTMENTAL KNEE;  Surgeon: Jodi Geralds, MD;  [facility];  Service: Orthopedics;  Laterality: Left;  . TONSILLECTOMY      Family History  Problem Relation Age of Onset  . Cancer Mother   . Kidney Stones Mother   . Thyroid disease Mother   . Thalassemia Mother   . Hypertension Father   . Cancer Father   . Stroke Father   . Diabetes Father   . Cancer Sister   . Heart failure Maternal Grandmother   . Thalassemia Maternal Grandmother   . Heart failure Maternal Grandfather   . Diabetes Paternal Grandmother   . Hypertension Paternal Grandfather   . Stroke Paternal Grandfather     Social History   Socioeconomic History  . Marital status: Married    Spouse name: Not on file  . Number of children: 2  . Years of education: Not on file  . Highest education level: Not on file  Occupational  History  . Occupation: Tax inspector    Comment: Psychiatric nurse  Tobacco Use  . Smoking status: Former Smoker    Years: 4.00    Quit date: 06/01/1991    Years since quitting: 28.7  . Smokeless tobacco: Never Used  Substance and Sexual Activity  . Alcohol use: No  . Drug use: No  . Sexual activity: Yes    Comment: esure coil  Other Topics Concern  . Not on file  Social History Narrative  . Not on file   Social Determinants of Health   Financial Resource Strain:   . Difficulty of Paying Living Expenses: Not on file  Food Insecurity:   . Worried About Programme researcher, broadcasting/film/video in the Last Year: Not on file  . Ran Out of Food in the Last Year: Not on file    Transportation Needs:   . Lack of Transportation (Medical): Not on file  . Lack of Transportation (Non-Medical): Not on file  Physical Activity:   . Days of Exercise per Week: Not on file  . Minutes of Exercise per Session: Not on file  Stress:   . Feeling of Stress : Not on file  Social Connections:   . Frequency of Communication with Friends and Family: Not on file  . Frequency of Social Gatherings with Friends and Family: Not on file  . Attends Religious Services: Not on file  . Active Member of Clubs or Organizations: Not on file  . Attends Banker Meetings: Not on file  . Marital Status: Not on file  Intimate Partner Violence:   . Fear of Current or Ex-Partner: Not on file  . Emotionally Abused: Not on file  . Physically Abused: Not on file  . Sexually Abused: Not on file     Current Outpatient Medications:  .  famotidine (PEPCID) 40 MG tablet, Take 40 mg by mouth at bedtime., Disp: , Rfl:  .  lisinopril (ZESTRIL) 5 MG tablet, Take 1 tablet (5 mg total) by mouth daily., Disp: 90 tablet, Rfl: 1 .  loratadine (CLARITIN) 10 MG tablet, Take 10 mg by mouth daily., Disp: , Rfl:  .  montelukast (SINGULAIR) 10 MG tablet, Take 1 tablet (10 mg total) by mouth at bedtime., Disp: 90 tablet, Rfl: 3 .  Multiple Vitamin (MULTIVITAMIN PO), Take by mouth., Disp: , Rfl:  .  PARoxetine (PAXIL) 40 MG tablet, Take 40 mg by mouth daily., Disp: , Rfl:  .  meloxicam (MOBIC) 15 MG tablet, Take 1 tablet (15 mg total) by mouth daily., Disp: 90 tablet, Rfl: 1   Allergies  Allergen Reactions  . No Known Allergies     ROS CONSTITUTIONAL: Negative for chills, fatigue, fever, unintentional weight gain and unintentional weight loss.  CARDIOVASCULAR: Negative for chest pain, dizziness, palpitations and pedal edema.  RESPIRATORY: Negative for recent cough and dyspnea.  GASTROINTESTINAL: Negative for abdominal pain, acid reflux symptoms, constipation, diarrhea, nausea and vomiting.  MSK:  see HPI INTEGUMENTARY: Negative for rash.  PSYCHIATRIC: Negative for sleep disturbance and to question depression screen.  Negative for depression, negative for anhedonia.        Objective:    PHYSICAL EXAM:   VS: BP (!) 144/82 (BP Location: Left Arm, Patient Position: Sitting)   Pulse (!) 114   Temp 98.1 F (36.7 C) (Temporal)   Ht 5\' 4"  (1.626 m)   Wt 210 lb 6.4 oz (95.4 kg)   SpO2 95%   BMI 36.12 kg/m  GEN: Well nourished, well developed, in no acute distress   Cardiac: RRR; no murmurs, rubs, or gallops,no edema - no significant varicosities Respiratory:  normal respiratory rate and pattern with no distress - normal breath sounds with no rales, rhonchi, wheezes or rubs Neuro:  Alert and Oriented x 3, Strength and sensation are intact - CN II-Xii grossly intact Psych: euthymic mood, appropriate affect and demeanor  BP (!) 144/82 (BP Location: Left Arm, Patient Position: Sitting)   Pulse (!) 114   Temp 98.1 F (36.7 C) (Temporal)   Ht 5\' 4"  (1.626 m)   Wt 210 lb 6.4 oz (95.4 kg)   SpO2 95%   BMI 36.12 kg/m  Wt Readings from Last 3 Encounters:  02/08/20 210 lb 6.4 oz (95.4 kg)  12/22/15 193 lb 9.6 oz (87.8 kg)  12/12/15 193 lb 9.6 oz (87.8 kg)     Health Maintenance Due  Topic Date Due  . PAP SMEAR-Modifier  Never done  . MAMMOGRAM  01/21/2020    There are no preventive care reminders to display for this patient.  No results found for: TSH Lab Results  Component Value Date   WBC 13.6 (H) 12/23/2015   HGB 10.0 (L) 12/23/2015   HCT 31.8 (L) 12/23/2015   MCV 66.1 (L) 12/23/2015   PLT 262 12/23/2015   Lab Results  Component Value Date   NA 138 12/12/2015   K 3.6 12/12/2015   CO2 26 12/12/2015   GLUCOSE 92 12/12/2015   BUN 12 12/12/2015   CREATININE 0.65 12/12/2015   BILITOT 1.6 (H) 12/12/2015   ALKPHOS 57 12/12/2015   AST 24 12/12/2015   ALT 23 12/12/2015   PROT 7.0 12/12/2015   ALBUMIN 4.0 12/12/2015   CALCIUM 9.2 12/12/2015   ANIONGAP 6  12/12/2015   No results found for: CHOL No results found for: HDL No results found for: LDLCALC No results found for: TRIG No results found for: CHOLHDL No results found for: 12/14/2015    Assessment & Plan:   Problem List Items Addressed This Visit      Cardiovascular and Mediastinum   Essential hypertension - Primary   Relevant Medications   lisinopril (ZESTRIL) 5 MG tablet   Other Relevant Orders   CBC with Differential/Platelet   Comprehensive metabolic panel   Lipid panel    Other Visit Diagnoses    Need for prophylactic vaccination and inoculation against influenza       Relevant Orders   Flu Vaccine MDCK QUAD PF (Completed)      Meds ordered this encounter  Medications  . lisinopril (ZESTRIL) 5 MG tablet    Sig: Take 1 tablet (5 mg total) by mouth daily.    Dispense:  90 tablet    Refill:  1    Order Specific Question:   Supervising Provider    [id number] Blane Ohara  . meloxicam (MOBIC) 15 MG tablet    Sig: Take 1 tablet (15 mg total) by mouth daily.    Dispense:  90 tablet    Refill:  1    Order Specific Question:   Supervising Provider    Answer:   [id number]    Follow-up: Return in about 6 months (around 08/07/2020) for chronic fasting follow up.    SARA R Danil Wedge, PA-C

## 2020-02-09 LAB — COMPREHENSIVE METABOLIC PANEL
ALT: 26 IU/L (ref 0–32)
AST: 25 IU/L (ref 0–40)
Albumin/Globulin Ratio: 1.8 (ref 1.2–2.2)
Albumin: 4.6 g/dL (ref 3.8–4.9)
Alkaline Phosphatase: 79 IU/L (ref 48–121)
BUN/Creatinine Ratio: 21 (ref 9–23)
BUN: 15 mg/dL (ref 6–24)
Bilirubin Total: 1.2 mg/dL (ref 0.0–1.2)
CO2: 24 mmol/L (ref 20–29)
Calcium: 10 mg/dL (ref 8.7–10.2)
Chloride: 101 mmol/L (ref 96–106)
Creatinine, Ser: 0.71 mg/dL (ref 0.57–1.00)
GFR calc Af Amer: 112 mL/min/{1.73_m2} (ref >59)
GFR calc non Af Amer: 98 mL/min/{1.73_m2} (ref >59)
Globulin, Total: 2.5 g/dL (ref 1.5–4.5)
Glucose: 127 mg/dL — ABNORMAL HIGH (ref 65–99)
Potassium: 4.3 mmol/L (ref 3.5–5.2)
Sodium: 140 mmol/L (ref 134–144)
Total Protein: 7.1 g/dL (ref 6.0–8.5)

## 2020-02-09 LAB — CBC WITH DIFFERENTIAL/PLATELET
Basophils Absolute: 0.1 10*3/uL (ref 0.0–0.2)
Basos: 1 %
EOS (ABSOLUTE): 0.2 10*3/uL (ref 0.0–0.4)
Eos: 2 %
Hematocrit: 38.2 % (ref 34.0–46.6)
Hemoglobin: 11.8 g/dL (ref 11.1–15.9)
Immature Grans (Abs): 0 10*3/uL (ref 0.0–0.1)
Immature Granulocytes: 1 %
Lymphocytes Absolute: 1.8 10*3/uL (ref 0.7–3.1)
Lymphs: 22 %
MCH: 20.6 pg — ABNORMAL LOW (ref 26.6–33.0)
MCHC: 30.9 g/dL — ABNORMAL LOW (ref 31.5–35.7)
MCV: 67 fL — ABNORMAL LOW (ref 79–97)
Monocytes Absolute: 0.7 10*3/uL (ref 0.1–0.9)
Monocytes: 9 %
Neutrophils Absolute: 5.5 10*3/uL (ref 1.4–7.0)
Neutrophils: 65 %
Platelets: 352 10*3/uL (ref 150–450)
RBC: 5.73 x10E6/uL — ABNORMAL HIGH (ref 3.77–5.28)
RDW: 15 % (ref 11.7–15.4)
WBC: 8.3 10*3/uL (ref 3.4–10.8)

## 2020-02-09 LAB — LIPID PANEL
Chol/HDL Ratio: 4 ratio (ref 0.0–4.4)
Cholesterol, Total: 195 mg/dL (ref 100–199)
HDL: 49 mg/dL (ref >39)
LDL Chol Calc (NIH): 123 mg/dL — ABNORMAL HIGH (ref 0–99)
Triglycerides: 126 mg/dL (ref 0–149)
VLDL Cholesterol Cal: 23 mg/dL (ref 5–40)

## 2020-02-09 LAB — CARDIOVASCULAR RISK ASSESSMENT

## 2020-02-15 ENCOUNTER — Telehealth (INDEPENDENT_AMBULATORY_CARE_PROVIDER_SITE_OTHER): Payer: BC Managed Care – PPO | Admitting: Family Medicine

## 2020-02-15 ENCOUNTER — Encounter: Payer: Self-pay | Admitting: Family Medicine

## 2020-02-15 VITALS — BP 140/82 | Temp 98.0°F

## 2020-02-15 DIAGNOSIS — J018 Other acute sinusitis: Secondary | ICD-10-CM | POA: Diagnosis not present

## 2020-02-15 MED ORDER — CEFDINIR 300 MG PO CAPS: 300.0000 mg | ORAL_CAPSULE | Freq: Two times a day (BID) | ORAL | 0 refills | Status: DC

## 2020-02-15 NOTE — Progress Notes (Signed)
Virtual Visit via Telephone Note   This visit type was conducted due to national recommendations for restrictions regarding the COVID-19 Pandemic (e.g. social distancing) in an effort to limit this patient's exposure and mitigate transmission in our community.  Due to her co-morbid illnesses, this patient is at least at moderate risk for complications without adequate follow up.  This format is felt to be most appropriate for this patient at this time.  The patient did not have access to video technology/had technical difficulties with video requiring transitioning to audio format only (telephone).  All issues noted in this document were discussed and addressed.  No physical exam could be performed with this format.  Patient verbally consented to a telehealth visit.   Date:  02/15/2020   ID:  Samantha Christensen, DOB [birth year], MRN [id number]  Patient Location: Home Provider Location: Office/Clinic  PCP:  Blane Ohara, MD   Evaluation Performed: acute visit  Chief Complaint:  Sinus congestion  History of Present Illness:    Samantha Christensen is a 54 y.o. female with sinus congestion, drainage. Yellow mucous, ears stopped up for 2 weeks. No known exposure to covid 19. Has not had her covid vaccines.  Denies loss of sense of taste and smell. No cough, shortness of breath.  The patient does not have symptoms concerning for COVID-19 infection (fever, chills, cough, or new shortness of breath).    Past Medical History:  Diagnosis Date  . Acute DVT (deep venous thrombosis) ([facility]) 03/2015   Left Leg after knee replacement  . Bell's palsy affecting pregnancy in third trimester 1997   left  . DJD (degenerative joint disease)   . GERD (gastroesophageal reflux disease)   . Headache 2004   severe head ache - left no cause found- no problem since then  . Hypertension   . Nasal fracture    broke it when she broke her radius, stitches under nose.  Marland Kitchen PONV (postoperative nausea and vomiting)    No  problem if she has the patch  . Seasonal allergies   . Shortness of breath dyspnea    with exertion  . Thalassemia- hemoglobin I disease 1985    Past Surgical History:  Procedure Laterality Date  . CERVICAL SPINE SURGERY     fusion  . CESAREAN SECTION     x2  . CHOLECYSTECTOMY    . DILATION AND CURETTAGE OF UTERUS    . ENDOMETRIAL ABLATION    . KNEE ARTHROSCOPY  2016  . OPEN REDUCTION INTERNAL FIXATION (ORIF) DISTAL RADIAL FRACTURE Right 03/10/2015   Procedure: OPEN TREATMENT OF RIGHT DISTAL RADIUS FRACTURE;  Surgeon: Mack Hook, MD;  Location: [facility];  Service: Orthopedics;  Laterality: Right;  . PARTIAL KNEE ARTHROPLASTY Left 12/22/2015   Procedure: UNICOMPARTMENTAL KNEE;  Surgeon: Jodi Geralds, MD;  Location[facility];  Service: Orthopedics;  Laterality: Left;  . TONSILLECTOMY      Family History  Problem Relation Age of Onset  . Cancer Mother   . Kidney Stones Mother   . Thyroid disease Mother   . Thalassemia Mother   . Hypertension Father   . Cancer Father   . Stroke Father   . Diabetes Father   . Cancer Sister   . Heart failure Maternal Grandmother   . Thalassemia Maternal Grandmother   . Heart failure Maternal Grandfather   . Diabetes Paternal Grandmother   . Hypertension Paternal Grandfather   . Stroke Paternal Grandfather     Social History  Socioeconomic History  . Marital status: Married    Spouse name: Not on file  . Number of children: 2  . Years of education: Not on file  . Highest education level: Not on file  Occupational History  . Occupation: Tax inspector    Comment: Psychiatric nurse  Tobacco Use  . Smoking status: Former Smoker    Years: 4.00    Quit date: 06/01/1991    Years since quitting: 28.7  . Smokeless tobacco: Never Used  Substance and Sexual Activity  . Alcohol use: No  . Drug use: No  . Sexual activity: Yes    Comment: esure coil  Other Topics Concern  . Not on file    Social History Narrative  . Not on file   Social Determinants of Health   Financial Resource Strain:   . Difficulty of Paying Living Expenses: Not on file  Food Insecurity:   . Worried About Programme researcher, broadcasting/film/video in the Last Year: Not on file  . Ran Out of Food in the Last Year: Not on file  Transportation Needs:   . Lack of Transportation (Medical): Not on file  . Lack of Transportation (Non-Medical): Not on file  Physical Activity:   . Days of Exercise per Week: Not on file  . Minutes of Exercise per Session: Not on file  Stress:   . Feeling of Stress : Not on file  Social Connections:   . Frequency of Communication with Friends and Family: Not on file  . Frequency of Social Gatherings with Friends and Family: Not on file  . Attends Religious Services: Not on file  . Active Member of Clubs or Organizations: Not on file  . Attends Banker Meetings: Not on file  . Marital Status: Not on file  Intimate Partner Violence:   . Fear of Current or Ex-Partner: Not on file  . Emotionally Abused: Not on file  . Physically Abused: Not on file  . Sexually Abused: Not on file    Outpatient Medications Prior to Visit  Medication Sig Dispense Refill  . famotidine (PEPCID) 40 MG tablet Take 40 mg by mouth at bedtime.    Marland Kitchen lisinopril (ZESTRIL) 5 MG tablet Take 1 tablet (5 mg total) by mouth daily. 90 tablet 1  . loratadine (CLARITIN) 10 MG tablet Take 10 mg by mouth daily.    . meloxicam (MOBIC) 15 MG tablet Take 1 tablet (15 mg total) by mouth daily. 90 tablet 1  . montelukast (SINGULAIR) 10 MG tablet Take 1 tablet (10 mg total) by mouth at bedtime. 90 tablet 3  . Multiple Vitamin (MULTIVITAMIN PO) Take by mouth.    Marland Kitchen PARoxetine (PAXIL) 40 MG tablet Take 40 mg by mouth daily.     No facility-administered medications prior to visit.   .med Allergies:   No known allergies   Social History   Tobacco Use  . Smoking status: Former Smoker    Years: 4.00    Quit date:  06/01/1991    Years since quitting: 28.7  . Smokeless tobacco: Never Used  Substance Use Topics  . Alcohol use: No  . Drug use: No     Review of Systems  Constitutional: Negative for chills, fever and malaise/fatigue.  HENT: Positive for congestion and ear pain. Negative for sinus pain and sore throat.        PND  Respiratory: Negative for cough and shortness of breath.   Cardiovascular: Negative for chest pain.  Musculoskeletal: Negative for myalgias.  Neurological: Negative for headaches.     Labs/Other Tests and Data Reviewed:    Recent Labs: 02/08/2020: ALT 26; BUN 15; Creatinine, Ser 0.71; Hemoglobin 11.8; Platelets 352; Potassium 4.3; Sodium 140   Recent Lipid Panel Lab Results  Component Value Date/Time   CHOL 195 02/08/2020 09:05 AM   TRIG 126 02/08/2020 09:05 AM   HDL 49 02/08/2020 09:05 AM   CHOLHDL 4.0 02/08/2020 09:05 AM   LDLCALC 123 (H) 02/08/2020 09:05 AM    Wt Readings from Last 3 Encounters:  02/08/20 210 lb 6.4 oz (95.4 kg)  12/22/15 193 lb 9.6 oz (87.8 kg)  12/12/15 193 lb 9.6 oz (87.8 kg)     Objective:    Vital Signs:  BP 140/82   Temp 98 F (36.7 C)    Physical Exam Vitals reviewed.  Constitutional:      General: She is not in acute distress. Neurological:     Mental Status: She is alert.      ASSESSMENT & PLAN:   1. Acute non-recurrent sinusitis of other sinus - cefdinir (OMNICEF) 300 MG capsule; Take 1 capsule (300 mg total) by mouth 2 (two) times daily.  Dispense: 20 capsule; Refill: 0  I strongly recommend the covid 19 vaccinations (pfizer or moderna.)  Please also continue to practice social distancing, mask wearing, and hand washing.  Please call if you develop symptoms of COVID (fever, cough, shortness of breath, congestion, aches, loss of taset/smell, headaches) regardless of vaccination status..    Meds ordered this encounter  Medications  . cefdinir (OMNICEF) 300 MG capsule    Sig: Take 1 capsule (300 mg total) by mouth 2  (two) times daily.    Dispense:  20 capsule    Refill:  0    COVID-19 Education: The signs and symptoms of COVID-19 were discussed with the patient and how to seek care for testing (follow up with PCP or arrange E-visit). The importance of social distancing was discussed today.  Time:   Today, I have spent 10 minutes with the patient with telehealth technology discussing the above problems.    Follow Up:  In Person prn  Signed, Blane Ohara, MD  02/15/2020 9:32 AM    [facility] [place]

## 2020-03-28 ENCOUNTER — Encounter: Payer: Self-pay | Admitting: Family Medicine

## 2020-03-28 ENCOUNTER — Telehealth (INDEPENDENT_AMBULATORY_CARE_PROVIDER_SITE_OTHER): Payer: BC Managed Care – PPO | Admitting: Family Medicine

## 2020-03-28 VITALS — HR 96 | Ht 64.0 in | Wt 205.0 lb

## 2020-03-28 DIAGNOSIS — J301 Allergic rhinitis due to pollen: Secondary | ICD-10-CM | POA: Diagnosis not present

## 2020-03-28 DIAGNOSIS — J018 Other acute sinusitis: Secondary | ICD-10-CM | POA: Diagnosis not present

## 2020-03-28 DIAGNOSIS — R059 Cough, unspecified: Secondary | ICD-10-CM

## 2020-03-28 LAB — POC COVID19 BINAXNOW: SARS Coronavirus 2 Ag: NEGATIVE

## 2020-03-28 MED ORDER — FLUTICASONE PROPIONATE 50 MCG/ACT NA SUSP: 2.0000 | Freq: Every day | NASAL | 6 refills | Status: DC

## 2020-03-28 MED ORDER — AMOXICILLIN-POT CLAVULANATE 875-125 MG PO TABS: 1.0000 | ORAL_TABLET | Freq: Two times a day (BID) | ORAL | 0 refills | Status: DC

## 2020-03-28 NOTE — Progress Notes (Signed)
Virtual Visit via Telephone Note   This visit type was conducted due to national recommendations for restrictions regarding the COVID-19 Pandemic (e.g. social distancing) in an effort to limit this patient's exposure and mitigate transmission in our community.  Due to her co-morbid illnesses, this patient is at least at moderate risk for complications without adequate follow up.  This format is felt to be most appropriate for this patient at this time.  The patient did not have access to video technology/had technical difficulties with video requiring transitioning to audio format only (telephone).  All issues noted in this document were discussed and addressed.  No physical exam could be performed with this format.  Patient verbally consented to a telehealth visit.   Date:  03/28/2020   ID:  Samantha Christensen, DOB 1965/09/11, MRN [id number]  Patient Location: Home Provider Location: Office/Clinic  PCP:  Blane Ohara, MD   Evaluation Performed: acute   Chief Complaint: cough  History of Present Illness:    Samantha Christensen is a 54 y.o. female with sinusitis symptoms which started 1 week ago. Patient has been treated with xyzal, prednisone.  It did not improve. Had to do a televisit from [place].  Changed singulair to xyzal. Stopped claritin. She complains of hoarseness and sinus pain and pressure.  Using afrin once a day every night.    Past Medical History:  Diagnosis Date  . Acute DVT (deep venous thrombosis) (HCC) 03/2015   Left Leg after knee replacement  . Bell's palsy affecting pregnancy in third trimester 1997   left  . DJD (degenerative joint disease)   . GERD (gastroesophageal reflux disease)   . Headache 2004   severe head ache - left no cause found- no problem since then  . Hypertension   . Nasal fracture    broke it when she broke her radius, stitches under nose.  Marland Kitchen PONV (postoperative nausea and vomiting)    No problem if she has the patch  . Seasonal allergies   .  Shortness of breath dyspnea    with exertion  . Thalassemia- hemoglobin I disease 1985    Past Surgical History:  Procedure Laterality Date  . CERVICAL SPINE SURGERY     fusion  . CESAREAN SECTION     x2  . CHOLECYSTECTOMY    . DILATION AND CURETTAGE OF UTERUS    . ENDOMETRIAL ABLATION    . KNEE ARTHROSCOPY  2016  . OPEN REDUCTION INTERNAL FIXATION (ORIF) DISTAL RADIAL FRACTURE Right 03/10/2015   Procedure: OPEN TREATMENT OF RIGHT DISTAL RADIUS FRACTURE;  Surgeon: Mack Hook, MD;  Location: [facility];  Service: Orthopedics;  Laterality: Right;  . PARTIAL KNEE ARTHROPLASTY Left 12/22/2015   Procedure: UNICOMPARTMENTAL KNEE;  Surgeon: Jodi Geralds, MD;  Location: [facility];  Service: Orthopedics;  Laterality: Left;  . TONSILLECTOMY      Family History  Problem Relation Age of Onset  . Cancer Mother   . Kidney Stones Mother   . Thyroid disease Mother   . Thalassemia Mother   . Hypertension Father   . Cancer Father   . Stroke Father   . Diabetes Father   . Cancer Sister   . Heart failure Maternal Grandmother   . Thalassemia Maternal Grandmother   . Heart failure Maternal Grandfather   . Diabetes Paternal Grandmother   . Hypertension Paternal Grandfather   . Stroke Paternal Grandfather     Social History   Socioeconomic History  . Marital status: Married  Spouse name: Not on file  . Number of children: 2  . Years of education: Not on file  . Highest education level: Not on file  Occupational History  . Occupation: Tax inspector    Comment: Psychiatric nurse  Tobacco Use  . Smoking status: Former Smoker    Years: 4.00    Quit date: 06/01/1991    Years since quitting: 28.8  . Smokeless tobacco: Never Used  Substance and Sexual Activity  . Alcohol use: No  . Drug use: No  . Sexual activity: Yes    Comment: esure coil  Other Topics Concern  . Not on file  Social History Narrative  . Not on file   Social  Determinants of Health   Financial Resource Strain:   . Difficulty of Paying Living Expenses: Not on file  Food Insecurity:   . Worried About Programme researcher, broadcasting/film/video in the Last Year: Not on file  . Ran Out of Food in the Last Year: Not on file  Transportation Needs:   . Lack of Transportation (Medical): Not on file  . Lack of Transportation (Non-Medical): Not on file  Physical Activity:   . Days of Exercise per Week: Not on file  . Minutes of Exercise per Session: Not on file  Stress:   . Feeling of Stress : Not on file  Social Connections:   . Frequency of Communication with Friends and Family: Not on file  . Frequency of Social Gatherings with Friends and Family: Not on file  . Attends Religious Services: Not on file  . Active Member of Clubs or Organizations: Not on file  . Attends Banker Meetings: Not on file  . Marital Status: Not on file  Intimate Partner Violence:   . Fear of Current or Ex-Partner: Not on file  . Emotionally Abused: Not on file  . Physically Abused: Not on file  . Sexually Abused: Not on file    Outpatient Medications Prior to Visit  Medication Sig Dispense Refill  . Levocetirizine Dihydrochloride (XYZAL ALLERGY 24HR PO) Take by mouth.    . famotidine (PEPCID) 40 MG tablet Take 40 mg by mouth at bedtime.    Marland Kitchen lisinopril (ZESTRIL) 5 MG tablet Take 1 tablet (5 mg total) by mouth daily. 90 tablet 1  . meloxicam (MOBIC) 15 MG tablet Take 1 tablet (15 mg total) by mouth daily. 90 tablet 1  . Multiple Vitamin (MULTIVITAMIN PO) Take by mouth.    Marland Kitchen PARoxetine (PAXIL) 40 MG tablet Take 40 mg by mouth daily.    . cefdinir (OMNICEF) 300 MG capsule Take 1 capsule (300 mg total) by mouth 2 (two) times daily. 20 capsule 0  . loratadine (CLARITIN) 10 MG tablet Take 10 mg by mouth daily.    . montelukast (SINGULAIR) 10 MG tablet Take 1 tablet (10 mg total) by mouth at bedtime. 90 tablet 3   No facility-administered medications prior to visit.     Allergies:   No known allergies   Social History   Tobacco Use  . Smoking status: Former Smoker    Years: 4.00    Quit date: 06/01/1991    Years since quitting: 28.8  . Smokeless tobacco: Never Used  Substance Use Topics  . Alcohol use: No  . Drug use: No     Review of Systems  Constitutional: Negative for chills and fever.  HENT: Positive for congestion (yellow), ear pain (Right ear in the morning somtimes) and sinus pain.  Sneezing  Respiratory: Positive for cough (minor-due to PND). Negative for shortness of breath.   Cardiovascular: Positive for PND.  Gastrointestinal: Negative for diarrhea, nausea and vomiting.  Neurological: Positive for headaches ("Right across eyebrows").     Labs/Other Tests and Data Reviewed:    Recent Labs: 02/08/2020: ALT 26; BUN 15; Creatinine, Ser 0.71; Hemoglobin 11.8; Platelets 352; Potassium 4.3; Sodium 140   Recent Lipid Panel Lab Results  Component Value Date/Time   CHOL 195 02/08/2020 09:05 AM   TRIG 126 02/08/2020 09:05 AM   HDL 49 02/08/2020 09:05 AM   CHOLHDL 4.0 02/08/2020 09:05 AM   LDLCALC 123 (H) 02/08/2020 09:05 AM    Wt Readings from Last 3 Encounters:  03/28/20 205 lb (93 kg)  02/08/20 210 lb 6.4 oz (95.4 kg)  12/22/15 193 lb 9.6 oz (87.8 kg)     Objective:    Vital Signs:  Pulse 96   Ht 5\' 4"  (1.626 m)   Wt 205 lb (93 kg)   SpO2 96%   BMI 35.19 kg/m    Physical Exam  Sounds hoarse. Congested. ASSESSMENT & PLAN:   1. Cough - POC COVID-19 BinaxNow  negative. 2. Allergic rhinitis  Recommend restarting singulair 10 mg once daily.  Recommend continue xyzal.  Start flonase  Stop Afrin. Sinus rinse.   3. Other acute sinusitis Augmentin rx given. If pt is not improving by next week, may take antibiotic.   Orders Placed This Encounter  Procedures  . POC COVID-19 BinaxNow     No orders of the defined types were placed in this encounter.   COVID-19 Education: The signs and symptoms of  COVID-19 were discussed with the patient and how to seek care for testing (follow up with PCP or arrange E-visit). The importance of social distancing was discussed today.  Time:   Today, I have spent 10 minutes with the patient with telehealth technology discussing the above problems.    Follow Up:  In Person prn  Signed, , MD  03/28/2020 1:05 PM    [facility] [place]

## 2020-03-28 NOTE — Patient Instructions (Signed)
Allergic rhinitis/sinusitis Recommend restarting singulair 10 mg once daily.  Recommend continue xyzal.  Start flonase Stop Afrin. Could also try a McNeil Sinus rinse OTC. Augmentin prescription. sent in case not improving by next week. This will treat a sinus infection.   Allergic Rhinitis, Adult Allergic rhinitis is a reaction to allergens in the air. Allergens are tiny specks (particles) in the air that cause your body to have an allergic reaction. This condition cannot be passed from person to person (is not contagious). Allergic rhinitis cannot be cured, but it can be controlled. There are two types of allergic rhinitis:  Seasonal. This type is also called hay fever. It happens only during certain times of the year.  Perennial. This type can happen at any time of the year. What are the causes? This condition may be caused by:  Pollen from grasses, trees, and weeds.  House dust mites.  Pet dander.  Mold. What are the signs or symptoms? Symptoms of this condition include:  Sneezing.  Runny or stuffy nose (nasal congestion).  A lot of mucus in the back of the throat (postnasal drip).  Itchy nose.  Tearing of the eyes.  Trouble sleeping.  Being sleepy during day. How is this treated? There is no cure for this condition. You should avoid things that trigger your symptoms (allergens). Treatment can help to relieve symptoms. This may include:  Medicines that block allergy symptoms, such as antihistamines. These may be given as a shot, nasal spray, or pill.  Shots that are given until your body becomes less sensitive to the allergen (desensitization).  Stronger medicines, if all other treatments have not worked. Follow these instructions at home: Avoiding allergens   Find out what you are allergic to. Common allergens include smoke, dust, and pollen.  Avoid them if you can. These are some of the things that you can do to avoid allergens: ? Replace carpet with  wood, tile, or vinyl flooring. Carpet can trap dander and dust. ? Clean any mold found in the home. ? Do not smoke. Do not allow smoking in your home. ? Change your heating and air conditioning filter at least once a month. ? During allergy season:  Keep windows closed as much as you can. If possible, use air conditioning when there is a lot of pollen in the air.  Use a special filter for allergies with your furnace and air conditioner.  Plan outdoor activities when pollen counts are lowest. This is usually during the early morning or evening hours.  If you do go outdoors when pollen count is high, wear a special mask for people with allergies.  When you come indoors, take a shower and change your clothes before sitting on furniture or bedding. General instructions  Do not use fans in your home.  Do not hang clothes outside to dry.  Wear sunglasses to keep pollen out of your eyes.  Wash your hands right away after you touch household pets.  Take over-the-counter and prescription medicines only as told by your doctor.  Keep all follow-up visits as told by your doctor. This is important. Contact a doctor if:  You have a fever.  You have a cough that does not go away (is persistent).  You start to make whistling sounds when you breathe (wheeze).  Your symptoms do not get better with treatment.  You have thick fluid coming from your nose.  You start to have nosebleeds. Get help right away if:  Your tongue or your lips  are swollen.  You have trouble breathing.  You feel dizzy or you feel like you are going to pass out (faint).  You have cold sweats. Summary  Allergic rhinitis is a reaction to allergens in the air.  This condition may be caused by allergens. These include pollen, dust mites, pet dander, and mold.  Symptoms include a runny, itchy nose, sneezing, or tearing eyes. You may also have trouble sleeping or feel sleepy during the day.  Treatment includes  taking medicines and avoiding allergens. You may also get shots or take stronger medicines.  Get help if you have a fever or a cough that does not stop. Get help right away if you are short of breath. This information is not intended to replace advice given to you by your health care provider. Make sure you discuss any questions you have with your health care provider. Document Revised: 09/05/2018 Document Reviewed: 12/06/2017 Elsevier Patient Education  2020 [employer].

## 2020-05-06 ENCOUNTER — Ambulatory Visit: Payer: BC Managed Care – PPO | Admitting: Family Medicine

## 2020-05-06 ENCOUNTER — Encounter: Payer: Self-pay | Admitting: Family Medicine

## 2020-05-06 ENCOUNTER — Other Ambulatory Visit: Payer: Self-pay

## 2020-05-06 VITALS — BP 144/72 | HR 107 | Resp 18 | Ht 64.0 in | Wt 211.2 lb

## 2020-05-06 DIAGNOSIS — J329 Chronic sinusitis, unspecified: Secondary | ICD-10-CM

## 2020-05-06 MED ORDER — DOXYCYCLINE HYCLATE 100 MG PO TABS: 100.0000 mg | ORAL_TABLET | Freq: Two times a day (BID) | ORAL | 0 refills | Status: DC

## 2020-05-06 NOTE — Progress Notes (Signed)
Acute Office Visit  Subjective:    Patient ID: Samantha Christensen, female    DOB: 06/22/1965, 54 y.o.   MRN: [id number]  Chief Complaint  Patient presents with  . Nasal Congestion  . Hoarse    HPI Patient is in today for nasal congestion noted while at [facility] -pt completed a televisit  -prednisone , claritin suggested. Upon arriving home Dr. Adonis Huguenin pt treatment to singulair/xyzal/ Flonase in the morning-antibiotic given-Augmentin. In the last six week-thick drainage-sore throat continues .  After antibiotic drainage improved but did not resolve. Pt has ear pain- and feels stopped up with drainage. Cough-productive-thick.clear No asthma, no smoking No past h/o allergies Past Medical History:  Diagnosis Date  . Acute DVT (deep venous thrombosis) (HCC) 03/2015   Left Leg after knee replacement  . Bell's palsy affecting pregnancy in third trimester 1997   left  . DJD (degenerative joint disease)   . GERD (gastroesophageal reflux disease)   . Headache 2004   severe head ache - left no cause found- no problem since then  . Hypertension   . Nasal fracture    broke it when she broke her radius, stitches under nose.  Marland Kitchen PONV (postoperative nausea and vomiting)    No problem if she has the patch  . Seasonal allergies   . Shortness of breath dyspnea    with exertion  . Thalassemia- hemoglobin I disease 1985    Past Surgical History:  Procedure Laterality Date  . CERVICAL SPINE SURGERY     fusion  . CESAREAN SECTION     x2  . CHOLECYSTECTOMY    . DILATION AND CURETTAGE OF UTERUS    . ENDOMETRIAL ABLATION    . KNEE ARTHROSCOPY  2016  . OPEN REDUCTION INTERNAL FIXATION (ORIF) DISTAL RADIAL FRACTURE Right 03/10/2015   Procedure: OPEN TREATMENT OF RIGHT DISTAL RADIUS FRACTURE;  Surgeon: Mack Hook, MD;  Location: [facility];  Service: Orthopedics;  Laterality: Right;  . PARTIAL KNEE ARTHROPLASTY Left 12/22/2015   Procedure: UNICOMPARTMENTAL KNEE;  Surgeon: Jodi Geralds, MD;  Location: [facility];  Service: Orthopedics;  Laterality: Left;  . TONSILLECTOMY      Family History  Problem Relation Age of Onset  . Cancer Mother   . Kidney Stones Mother   . Thyroid disease Mother   . Thalassemia Mother   . Hypertension Father   . Cancer Father   . Stroke Father   . Diabetes Father   . Cancer Sister   . Heart failure Maternal Grandmother   . Thalassemia Maternal Grandmother   . Heart failure Maternal Grandfather   . Diabetes Paternal Grandmother   . Hypertension Paternal Grandfather   . Stroke Paternal Grandfather     Social History   Socioeconomic History  . Marital status: Married    Spouse name: Not on file  . Number of children: 2  . Years of education: Not on file  . Highest education level: Not on file  Occupational History  . Occupation: Tax inspector    Comment: Psychiatric nurse  Tobacco Use  . Smoking status: Former Smoker    Years: 4.00    Quit date: 06/01/1991    Years since quitting: 28.9  . Smokeless tobacco: Never Used  Substance and Sexual Activity  . Alcohol use: No  . Drug use: No  . Sexual activity: Yes    Comment: esure coil  Other Topics Concern  . Not on file  Social History Narrative  .  Not on file   Social Determinants of Health   Financial Resource Strain:   . Difficulty of Paying Living Expenses: Not on file  Food Insecurity:   . Worried About Programme researcher, broadcasting/film/video in the Last Year: Not on file  . Ran Out of Food in the Last Year: Not on file  Transportation Needs:   . Lack of Transportation (Medical): Not on file  . Lack of Transportation (Non-Medical): Not on file  Physical Activity:   . Days of Exercise per Week: Not on file  . Minutes of Exercise per Session: Not on file  Stress:   . Feeling of Stress : Not on file  Social Connections:   . Frequency of Communication with Friends and Family: Not on file  . Frequency of Social Gatherings with Friends and Family: Not  on file  . Attends Religious Services: Not on file  . Active Member of Clubs or Organizations: Not on file  . Attends Banker Meetings: Not on file  . Marital Status: Not on file  Intimate Partner Violence:   . Fear of Current or Ex-Partner: Not on file  . Emotionally Abused: Not on file  . Physically Abused: Not on file  . Sexually Abused: Not on file    Outpatient Medications Prior to Visit  Medication Sig Dispense Refill  . famotidine (PEPCID) 40 MG tablet Take 40 mg by mouth at bedtime.    . fluticasone (FLONASE) 50 MCG/ACT nasal spray Place 2 sprays into both nostrils daily. 16 g 6  . Levocetirizine Dihydrochloride (XYZAL ALLERGY 24HR PO) Take by mouth.    Marland Kitchen lisinopril (ZESTRIL) 5 MG tablet Take 1 tablet (5 mg total) by mouth daily. 90 tablet 1  . meloxicam (MOBIC) 15 MG tablet Take 1 tablet (15 mg total) by mouth daily. 90 tablet 1  . Multiple Vitamin (MULTIVITAMIN PO) Take by mouth.    Marland Kitchen PARoxetine (PAXIL) 40 MG tablet Take 40 mg by mouth daily.    Marland Kitchen amoxicillin-clavulanate (AUGMENTIN) 875-125 MG tablet Take 1 tablet by mouth 2 (two) times daily. 20 tablet 0   No facility-administered medications prior to visit.    Allergies  Allergen Reactions  . No Known Allergies     Review of Systems  Constitutional: Negative for activity change, appetite change, fatigue and fever.  HENT: Positive for congestion, ear pain, sinus pressure, sore throat and voice change. Negative for rhinorrhea and trouble swallowing.   Respiratory: Positive for cough. Negative for shortness of breath and wheezing.   Cardiovascular: Negative for chest pain.  Gastrointestinal: Negative for diarrhea, nausea and vomiting.  Allergic/Immunologic: Positive for environmental allergies.  Neurological: Negative for dizziness and headaches.       Objective:    Physical Exam Constitutional:      Appearance: Normal appearance.  HENT:     Head: Normocephalic and atraumatic.     Right Ear:  Tympanic membrane, ear canal and external ear normal.     Left Ear: Tympanic membrane, ear canal and external ear normal.     Nose: Congestion present.     Comments: Edema with crusting blood drainage    Mouth/Throat:     Pharynx: No oropharyngeal exudate or posterior oropharyngeal erythema.  Eyes:     Conjunctiva/sclera: Conjunctivae normal.  Cardiovascular:     Rate and Rhythm: Normal rate and regular rhythm.     Pulses: Normal pulses.     Heart sounds: Normal heart sounds.  Pulmonary:     Effort: Pulmonary effort  is normal.     Breath sounds: Normal breath sounds.  Musculoskeletal:     Cervical back: Normal range of motion and neck supple.  Neurological:     Mental Status: She is alert.  Psychiatric:        Mood and Affect: Mood normal.        Behavior: Behavior normal.     BP (!) 144/72   Pulse (!) 107   Resp 18   Ht 5\' 4"  (1.626 m)   Wt 211 lb 3.2 oz (95.8 kg)   SpO2 94%   BMI 36.25 kg/m  Wt Readings from Last 3 Encounters:  05/06/20 211 lb 3.2 oz (95.8 kg)  03/28/20 205 lb (93 kg)  02/08/20 210 lb 6.4 oz (95.4 kg)    Health Maintenance Due  Topic Date Due  . PAP SMEAR-Modifier  Never done  . MAMMOGRAM  01/21/2020    No results found for: TSH Lab Results  Component Value Date   WBC 8.3 02/08/2020   HGB 11.8 02/08/2020   HCT 38.2 02/08/2020   MCV 67 (L) 02/08/2020   PLT 352 02/08/2020   Lab Results  Component Value Date   NA 140 02/08/2020   K 4.3 02/08/2020   CO2 24 02/08/2020   GLUCOSE 127 (H) 02/08/2020   BUN 15 02/08/2020   CREATININE 0.71 02/08/2020   BILITOT 1.2 02/08/2020   ALKPHOS 79 02/08/2020   AST 25 02/08/2020   ALT 26 02/08/2020   PROT 7.1 02/08/2020   ALBUMIN 4.6 02/08/2020   CALCIUM 10.0 02/08/2020   ANIONGAP 6 12/12/2015   Lab Results  Component Value Date   CHOL 195 02/08/2020   Lab Results  Component Value Date   HDL 49 02/08/2020   Lab Results  Component Value Date   LDLCALC 123 (H) 02/08/2020   Lab Results   Component Value Date   TRIG 126 02/08/2020   Lab Results  Component Value Date   CHOLHDL 4.0 02/08/2020    Assessment & Plan:  1. Sinusitis, unspecified chronicity, unspecified location Trial of mucinex/flonase/nasal saline-no additional antibiotics started. Avoid antihistamines until acute symptoms resolve.  Drink plenty of water. If hoarseness persists after acute symptoms, referral recommended to ENT for additional evaluation. TM normal, no facial edema-nasal congestion with bloody mucous noted Audriana Aldama 04/09/2020, MD

## 2020-05-06 NOTE — Patient Instructions (Signed)

## 2020-05-15 ENCOUNTER — Other Ambulatory Visit: Payer: Self-pay

## 2020-05-15 ENCOUNTER — Ambulatory Visit (INDEPENDENT_AMBULATORY_CARE_PROVIDER_SITE_OTHER): Payer: BC Managed Care – PPO

## 2020-05-15 DIAGNOSIS — Z20822 Contact with and (suspected) exposure to covid-19: Secondary | ICD-10-CM | POA: Diagnosis not present

## 2020-05-15 DIAGNOSIS — Z1152 Encounter for screening for COVID-19: Secondary | ICD-10-CM

## 2020-05-15 LAB — POC COVID19 BINAXNOW: SARS Coronavirus 2 Ag: NEGATIVE

## 2020-05-15 NOTE — Progress Notes (Signed)
      Patient came in today for a rapid COVID test as a requirement for attending a ball game tonight.  Results for orders placed or performed in visit on 05/15/20 (from the past 24 hour(s))  POC COVID-19     Status: Normal   Collection Time: 05/15/20  8:18 AM  Result Value Ref Range   SARS Coronavirus 2 Ag Negative Negative     Creola Corn, LPN 67/12/45 8:09 AM

## 2020-06-05 ENCOUNTER — Telehealth (INDEPENDENT_AMBULATORY_CARE_PROVIDER_SITE_OTHER): Payer: BC Managed Care – PPO | Admitting: Family Medicine

## 2020-06-05 ENCOUNTER — Encounter: Payer: Self-pay | Admitting: Family Medicine

## 2020-06-05 VITALS — HR 95 | Temp 99.6°F

## 2020-06-05 DIAGNOSIS — R509 Fever, unspecified: Secondary | ICD-10-CM | POA: Diagnosis not present

## 2020-06-05 DIAGNOSIS — J329 Chronic sinusitis, unspecified: Secondary | ICD-10-CM

## 2020-06-05 DIAGNOSIS — Z20822 Contact with and (suspected) exposure to covid-19: Secondary | ICD-10-CM | POA: Diagnosis not present

## 2020-06-05 DIAGNOSIS — R059 Cough, unspecified: Secondary | ICD-10-CM

## 2020-06-05 LAB — POCT INFLUENZA A/B
Influenza A, POC: NEGATIVE
Influenza B, POC: NEGATIVE

## 2020-06-05 LAB — POC COVID19 BINAXNOW: SARS Coronavirus 2 Ag: NEGATIVE

## 2020-06-05 MED ORDER — LEVOFLOXACIN 500 MG PO TABS: 500.0000 mg | ORAL_TABLET | Freq: Every day | ORAL | 0 refills | Status: DC

## 2020-06-05 NOTE — Progress Notes (Signed)
Complains cough, nasal congestion, drainage, fever, body aches (joints), chest congestion Denies sore throat, loss of taste/smell.  Was seen in December was put on antibiotic, has not helped. Christmas eve lost voice, rattle cough. Nasal congestion and drainage. Fever starting yesterday @ 100.7. Neighbor tested positive yesterday which was in house on Monday. Has been taking tylenol. 99.6 this morning. Has sick taste in mouth. States over Christmas oxygen did drop to lowest at 92%.  Has not had covid vaccines, Did have flu shot. No one else in household is sick, just neighbor +.  Virtual Visit via Telephone Note  I connected with Samantha Christensen on 06/05/20 at  9:30 AM EST by telephone and verified that I am speaking with the correct person using two identifiers.  Location: Patient: home Provider: clinic   I discussed the limitations, risks, security and privacy concerns of performing an evaluation and management service by telephone and the availability of in person appointments. I also discussed with the patient that there may be a patient responsible charge related to this service. The patient expressed understanding and agreed to proceed.   History of Present Illness: Onset of symptoms yesterday-worsening throughout the day temp-elevated temp 100.7-overnight Neighbor tested + yesterday-exposed on Monday Dec 2021-sinusitis -doxy(previously Augmentin, Cephlorsporin) taken did not help -thick yellow brown-over Christmas-lost voice/cough-"deep"-no fever Cough continues-congestion No COVID COVID testing 05/05/20 No loss of taste or smell No rash No sore throat Body aches  No fatigue Observations/Objective: 99.6-temp 98%  Assessment and Plan: 1. Fever, unspecified fever cause Acute onset yesterday-exposure Monday-neighbor positive COVID-pt has not taken vaccines. 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 2. Cough Sinus infection intermittently since Sept-worsening cough over  last few days-acute on chronic cough with concern for sinusitis previously  3. Sinusitis, unspecified chronicity, unspecified location 3 antibiotics taken Sept-Dec with no resolution-suggested Levaquin 500mg  -risk/benefit/side effects-pt has taken 3 antibiotics previously with no improvement and ENT referral to evaluate additional concerns.   Follow Up Instructions: ENT referral    I discussed the assessment and treatment plan with the patient. The patient was provided an opportunity to ask questions and all were answered. The patient agreed with the plan and demonstrated an understanding of the instructions.   The patient was advised to call back or seek an in-person evaluation if the symptoms worsen or if the condition fails to improve as anticipated.  I provided  7  minutes of non-face-to-face time during this encounter.   Deegan Valentino , MD

## 2020-06-06 LAB — NOVEL CORONAVIRUS, NAA: SARS-CoV-2, NAA: NOT DETECTED

## 2020-06-06 LAB — SARS-COV-2, NAA 2 DAY TAT

## 2020-08-12 ENCOUNTER — Ambulatory Visit: Payer: BC Managed Care – PPO | Admitting: Family Medicine

## 2020-08-13 ENCOUNTER — Other Ambulatory Visit: Payer: Self-pay | Admitting: Physician Assistant

## 2020-08-21 ENCOUNTER — Other Ambulatory Visit: Payer: Self-pay

## 2020-08-21 ENCOUNTER — Ambulatory Visit: Payer: BC Managed Care – PPO | Admitting: Nurse Practitioner

## 2020-08-21 ENCOUNTER — Encounter: Payer: Self-pay | Admitting: Nurse Practitioner

## 2020-08-21 VITALS — BP 128/72 | HR 86 | Temp 97.3°F | Ht 64.0 in | Wt 206.0 lb

## 2020-08-21 DIAGNOSIS — M170 Bilateral primary osteoarthritis of knee: Secondary | ICD-10-CM | POA: Diagnosis not present

## 2020-08-21 DIAGNOSIS — Z6835 Body mass index (BMI) 35.0-35.9, adult: Secondary | ICD-10-CM

## 2020-08-21 DIAGNOSIS — J309 Allergic rhinitis, unspecified: Secondary | ICD-10-CM | POA: Diagnosis not present

## 2020-08-21 DIAGNOSIS — I10 Essential (primary) hypertension: Secondary | ICD-10-CM | POA: Diagnosis not present

## 2020-08-21 DIAGNOSIS — Z800 Family history of malignant neoplasm of digestive organs: Secondary | ICD-10-CM

## 2020-08-21 DIAGNOSIS — R634 Abnormal weight loss: Secondary | ICD-10-CM

## 2020-08-21 DIAGNOSIS — D563 Thalassemia minor: Secondary | ICD-10-CM | POA: Diagnosis not present

## 2020-08-21 DIAGNOSIS — R10817 Generalized abdominal tenderness: Secondary | ICD-10-CM

## 2020-08-21 NOTE — Patient Instructions (Addendum)
Continue current medications Continue heart healthy diet and physical activity Follow up in 106-months   https://www.mata.com/https://www.nhlbi.nih.gov/files/docs/public/heart/dash_brief.pdf">  DASH Eating Plan DASH stands for Dietary Approaches to Stop Hypertension. The DASH eating plan is a healthy eating plan that has been shown to:  Reduce high blood pressure (hypertension).  Reduce your risk for type 2 diabetes, heart disease, and stroke.  Help with weight loss. What are tips for following this plan? Reading food labels  Check food labels for the amount of salt (sodium) per serving. Choose foods with less than 5 percent of the Daily Value of sodium. Generally, foods with less than 300 milligrams (mg) of sodium per serving fit into this eating plan.  To find whole grains, look for the word "whole" as the first word in the ingredient list. Shopping  Buy products labeled as "low-sodium" or "no salt added."  Buy fresh foods. Avoid canned foods and pre-made or frozen meals. Cooking  Avoid adding salt when cooking. Use salt-free seasonings or herbs instead of table salt or sea salt. Check with your health care provider or pharmacist before using salt substitutes.  Do not fry foods. Cook foods using healthy methods such as baking, boiling, grilling, roasting, and broiling instead.  Cook with heart-healthy oils, such as olive, canola, avocado, soybean, or sunflower oil. Meal planning  Eat a balanced diet that includes: ? 4 or more servings of fruits and 4 or more servings of vegetables each day. Try to fill one-half of your plate with fruits and vegetables. ? 6-8 servings of whole grains each day. ? Less than 6 oz (170 g) of lean meat, poultry, or fish each day. A 3-oz (85-g) serving of meat is about the same size as a deck of cards. One egg equals 1 oz (28 g). ? 2-3 servings of low-fat dairy each day. One serving is 1 cup (237 mL). ? 1 serving of nuts, seeds, or beans 5 times each week. ? 2-3 servings  of heart-healthy fats. Healthy fats called omega-3 fatty acids are found in foods such as walnuts, flaxseeds, fortified milks, and eggs. These fats are also found in cold-water fish, such as sardines, salmon, and mackerel.  Limit how much you eat of: ? Canned or prepackaged foods. ? Food that is high in trans fat, such as some fried foods. ? Food that is high in saturated fat, such as fatty meat. ? Desserts and other sweets, sugary drinks, and other foods with added sugar. ? Full-fat dairy products.  Do not salt foods before eating.  Do not eat more than 4 egg yolks a week.  Try to eat at least 2 vegetarian meals a week.  Eat more home-cooked food and less restaurant, buffet, and fast food.   Lifestyle  When eating at a restaurant, ask that your food be prepared with less salt or no salt, if possible.  If you drink alcohol: ? Limit how much you use to:  0-1 drink a day for women who are not pregnant.  0-2 drinks a day for men. ? Be aware of how much alcohol is in your drink. In the [place] one drink equals one 12 oz bottle of beer (355 mL), one 5 oz glass of wine (148 mL), or one 1 oz glass of hard liquor (44 mL). General information  Avoid eating more than 2,300 mg of salt a day. If you have hypertension, you may need to reduce your sodium intake to 1,500 mg a day.  Work with your health care provider to  maintain a healthy body weight or to lose weight. Ask what an ideal weight is for you.  Get at least 30 minutes of exercise that causes your heart to beat faster (aerobic exercise) most days of the week. Activities may include walking, swimming, or biking.  Work with your health care provider or dietitian to adjust your eating plan to your individual calorie needs. What foods should I eat? Fruits All fresh, dried, or frozen fruit. Canned fruit in natural juice (without added sugar). Vegetables Fresh or frozen vegetables (raw, steamed, roasted, or grilled). Low-sodium or  reduced-sodium tomato and vegetable juice. Low-sodium or reduced-sodium tomato sauce and tomato paste. Low-sodium or reduced-sodium canned vegetables. Grains Whole-grain or whole-wheat bread. Whole-grain or whole-wheat pasta. Brown rice. Samantha Christensen. Bulgur. Whole-grain and low-sodium cereals. Pita bread. Low-fat, low-sodium crackers. Whole-wheat flour tortillas. Meats and other proteins Skinless chicken or [place]. Ground chicken or [place]. Pork with fat trimmed off. Fish and seafood. Egg whites. Dried beans, peas, or lentils. Unsalted nuts, nut butters, and seeds. Unsalted canned beans. Lean cuts of beef with fat trimmed off. Low-sodium, lean precooked or cured meat, such as sausages or meat loaves. Dairy Low-fat (1%) or fat-free (skim) milk. Reduced-fat, low-fat, or fat-free cheeses. Nonfat, low-sodium ricotta or cottage cheese. Low-fat or nonfat yogurt. Low-fat, low-sodium cheese. Fats and oils Soft margarine without trans fats. Vegetable oil. Reduced-fat, low-fat, or light mayonnaise and salad dressings (reduced-sodium). Canola, safflower, olive, avocado, soybean, and sunflower oils. Avocado. Seasonings and condiments Herbs. Spices. Seasoning mixes without salt. Other foods Unsalted popcorn and pretzels. Fat-free sweets. The items listed above may not be a complete list of foods and beverages you can eat. Contact a dietitian for more information. What foods should I avoid? Fruits Canned fruit in a light or heavy syrup. Fried fruit. Fruit in cream or butter sauce. Vegetables Creamed or fried vegetables. Vegetables in a cheese sauce. Regular canned vegetables (not low-sodium or reduced-sodium). Regular canned tomato sauce and paste (not low-sodium or reduced-sodium). Regular tomato and vegetable juice (not low-sodium or reduced-sodium). Samantha Christensen. Olives. Grains Baked goods made with fat, such as croissants, muffins, or some breads. Dry pasta or rice meal packs. Meats and other  proteins Fatty cuts of meat. Ribs. Fried meat. Samantha Christensen. Bologna, salami, and other precooked or cured meats, such as sausages or meat loaves. Fat from the back of a pig (fatback). Bratwurst. Salted nuts and seeds. Canned beans with added salt. Canned or smoked fish. Whole eggs or egg yolks. Chicken or [place] with skin. Dairy Whole or 2% milk, cream, and half-and-half. Whole or full-fat cream cheese. Whole-fat or sweetened yogurt. Full-fat cheese. Nondairy creamers. Whipped toppings. Processed cheese and cheese spreads. Fats and oils Butter. Stick margarine. Lard. Shortening. Ghee. Bacon fat. Tropical oils, such as coconut, palm kernel, or palm oil. Seasonings and condiments Onion salt, garlic salt, seasoned salt, table salt, and sea salt. Worcestershire sauce. Tartar sauce. Barbecue sauce. Teriyaki sauce. Soy sauce, including reduced-sodium. Steak sauce. Canned and packaged gravies. Fish sauce. Oyster sauce. Cocktail sauce. Store-bought horseradish. Ketchup. Mustard. Meat flavorings and tenderizers. Bouillon cubes. Hot sauces. Pre-made or packaged marinades. Pre-made or packaged taco seasonings. Relishes. Regular salad dressings. Other foods Salted popcorn and pretzels. The items listed above may not be a complete list of foods and beverages you should avoid. Contact a dietitian for more information. Where to find more information  National Heart, Lung, and Blood Institute: https://wilson-eaton.com/  American Heart Association: www.heart.org  Academy of Nutrition and Dietetics: www.eatright.[place]: www.kidney.org  Summary  The DASH eating plan is a healthy eating plan that has been shown to reduce high blood pressure (hypertension). It may also reduce your risk for type 2 diabetes, heart disease, and stroke.  When on the DASH eating plan, aim to eat more fresh fruits and vegetables, whole grains, lean proteins, low-fat dairy, and heart-healthy fats.  With the DASH eating plan,  you should limit salt (sodium) intake to 2,300 mg a day. If you have hypertension, you may need to reduce your sodium intake to 1,500 mg a day.  Work with your health care provider or dietitian to adjust your eating plan to your individual calorie needs. This information is not intended to replace advice given to you by your health care provider. Make sure you discuss any questions you have with your health care provider. Document Revised: 04/20/2019 Document Reviewed: 04/20/2019 Elsevier Patient Education  2021 [employer] Managing Your Hypertension Hypertension, also called high blood pressure, is when the force of the blood pressing against the walls of the arteries is too strong. Arteries are blood vessels that carry blood from your heart throughout your body. Hypertension forces the heart to work harder to pump blood and may cause the arteries to become narrow or stiff. Understanding blood pressure readings Your personal target blood pressure may vary depending on your medical conditions, your age, and other factors. A blood pressure reading includes a higher number over a lower number. Ideally, your blood pressure should be below 120/80. You should know that:  The first, or top, number is called the systolic pressure. It is a measure of the pressure in your arteries as your heart beats.  The second, or bottom number, is called the diastolic pressure. It is a measure of the pressure in your arteries as the heart relaxes. Blood pressure is classified into four stages. Based on your blood pressure reading, your health care provider may use the following stages to determine what type of treatment you need, if any. Systolic pressure and diastolic pressure are measured in a unit called mmHg. Normal  Systolic pressure: below 120.  Diastolic pressure: below 80. Elevated  Systolic pressure: 120-129.  Diastolic pressure: below 80. Hypertension stage 1  Systolic pressure:  130-139.  Diastolic pressure: 80-89. Hypertension stage 2  Systolic pressure: 140 or above.  Diastolic pressure: 90 or above. How can this condition affect me? Managing your hypertension is an important responsibility. Over time, hypertension can damage the arteries and decrease blood flow to important parts of the body, including the brain, heart, and kidneys. Having untreated or uncontrolled hypertension can lead to:  A heart attack.  A stroke.  A weakened blood vessel (aneurysm).  Heart failure.  Kidney damage.  Eye damage.  Metabolic syndrome.  Memory and concentration problems.  Vascular dementia. What actions can I take to manage this condition? Hypertension can be managed by making lifestyle changes and possibly by taking medicines. Your health care provider will help you make a plan to bring your blood pressure within a normal range. Nutrition  Eat a diet that is high in fiber and potassium, and low in salt (sodium), added sugar, and fat. An example eating plan is called the Dietary Approaches to Stop Hypertension (DASH) diet. To eat this way: ? Eat plenty of fresh fruits and vegetables. Try to fill one-half of your plate at each meal with fruits and vegetables. ? Eat whole grains, such as whole-wheat pasta, brown rice, or whole-grain bread. Fill about one-fourth of your plate with  whole grains. ? Eat low-fat dairy products. ? Avoid fatty cuts of meat, processed or cured meats, and poultry with skin. Fill about one-fourth of your plate with lean proteins such as fish, chicken without skin, beans, eggs, and tofu. ? Avoid pre-made and processed foods. These tend to be higher in sodium, added sugar, and fat.  Reduce your daily sodium intake. Most people with hypertension should eat less than 1,500 mg of sodium a day.   Lifestyle  Work with your health care provider to maintain a healthy body weight or to lose weight. Ask what an ideal weight is for you.  Get at least  30 minutes of exercise that causes your heart to beat faster (aerobic exercise) most days of the week. Activities may include walking, swimming, or biking.  Include exercise to strengthen your muscles (resistance exercise), such as weight lifting, as part of your weekly exercise routine. Try to do these types of exercises for 30 minutes at least 3 days a week.  Do not use any products that contain nicotine or tobacco, such as cigarettes, e-cigarettes, and chewing tobacco. If you need help quitting, ask your health care provider.  Control any long-term (chronic) conditions you have, such as high cholesterol or diabetes.  Identify your sources of stress and find ways to manage stress. This may include meditation, deep breathing, or making time for fun activities.   Alcohol use  Do not drink alcohol if: ? Your health care provider tells you not to drink. ? You are pregnant, may be pregnant, or are planning to become pregnant.  If you drink alcohol: ? Limit how much you use to:  0-1 drink a day for women.  0-2 drinks a day for men. ? Be aware of how much alcohol is in your drink. In the [place] one drink equals one 12 oz bottle of beer (355 mL), one 5 oz glass of wine (148 mL), or one 1 oz glass of hard liquor (44 mL). Medicines Your health care provider may prescribe medicine if lifestyle changes are not enough to get your blood pressure under control and if:  Your systolic blood pressure is 130 or higher.  Your diastolic blood pressure is 80 or higher. Take medicines only as told by your health care provider. Follow the directions carefully. Blood pressure medicines must be taken as told by your health care provider. The medicine does not work as well when you skip doses. Skipping doses also puts you at risk for problems. Monitoring Before you monitor your blood pressure:  Do not smoke, drink caffeinated beverages, or exercise within 30 minutes before taking a measurement.  Use the  bathroom and empty your bladder (urinate).  Sit quietly for at least 5 minutes before taking measurements. Monitor your blood pressure at home as told by your health care provider. To do this:  Sit with your back straight and supported.  Place your feet flat on the floor. Do not cross your legs.  Support your arm on a flat surface, such as a table. Make sure your upper arm is at heart level.  Each time you measure, take two or three readings one minute apart and record the results. You may also need to have your blood pressure checked regularly by your health care provider.   General information  Talk with your health care provider about your diet, exercise habits, and other lifestyle factors that may be contributing to hypertension.  Review all the medicines you take with your health care provider  because there may be side effects or interactions.  Keep all visits as told by your health care provider. Your health care provider can help you create and adjust your plan for managing your high blood pressure. Where to find more information  National Heart, Lung, and Blood Institute: PopSteam.is  American Heart Association: www.heart.org Contact a health care provider if:  You think you are having a reaction to medicines you have taken.  You have repeated (recurrent) headaches.  You feel dizzy.  You have swelling in your ankles.  You have trouble with your vision. Get help right away if:  You develop a severe headache or confusion.  You have unusual weakness or numbness, or you feel faint.  You have severe pain in your chest or abdomen.  You vomit repeatedly.  You have trouble breathing. These symptoms may represent a serious problem that is an emergency. Do not wait to see if the symptoms will go away. Get medical help right away. Call your local emergency services (911 in the [place] Do not drive yourself to the hospital. Summary  Hypertension is when the force of  blood pumping through your arteries is too strong. If this condition is not controlled, it may put you at risk for serious complications.  Your personal target blood pressure may vary depending on your medical conditions, your age, and other factors. For most people, a normal blood pressure is less than 120/80.  Hypertension is managed by lifestyle changes, medicines, or both.  Lifestyle changes to help manage hypertension include losing weight, eating a healthy, low-sodium diet, exercising more, stopping smoking, and limiting alcohol. This information is not intended to replace advice given to you by your health care provider. Make sure you discuss any questions you have with your health care provider. Document Revised: 06/22/2019 Document Reviewed: 04/17/2019 Elsevier Patient Education  2021 [employer].

## 2020-08-21 NOTE — Progress Notes (Addendum)
Established Patient Office Visit  Subjective:  Patient ID: Samantha Christensen, female    DOB: [birth year]  Age: 55 y.o. MRN: [id number]  CC: Follow-up hypertension  HPI Samantha Christensen is a 55 year old Caucasian female that presents for follow-up of hypertension, GERD, and osteoarthritis.She has thalassemia. She has lost 6 pounds unintentionally and is experiencing intermittent abdominal pain. She is concerned because she lost mother in 2020 to pancreatic cancer. She is experiencing intermittent abdominal tenderness.  Mammogram and pap smear are up-to-date.  Hypertension Samantha Christensen has a history of hypertension since 2015. Current treatment includes Lisinopril 5 mg daily. She denies chest pain, dizziness, or headache. BP 128/72 today in office. She consumes a regular diet. 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 She is adherent to medication regimen and follow-up appointments.  GERD Samantha Christensen has a history of GERD since 2019. Current treatment includes Pepcid 40 mg QHS. Symptoms are well-controlled. She denies difficulty swallowing, regurgitation, or heartburn.   Osteoarthritis Samantha Christensen has a history of bilateral knee osteoarthritis for several years. Current treatment includes Mobic 15 mg daily. She has undergone bilateral total knee replacements. States OA symptoms are well-controlled.   Past Medical History:  Diagnosis Date   Acute DVT (deep venous thrombosis) (HCC) 03/2015   Left Leg after knee replacement   Bell's palsy affecting pregnancy in third trimester 1997   left   DJD (degenerative joint disease)    GERD (gastroesophageal reflux disease)    Headache 2004   severe head ache - left no cause found- no problem since then   Hypertension    Nasal fracture    broke it when she broke her radius, stitches under nose.   PONV (postoperative nausea and vomiting)    No problem if she has the patch   Seasonal allergies    Shortness  of breath dyspnea    with exertion   Thalassemia- hemoglobin I disease 1985    Past Surgical History:  Procedure Laterality Date   CERVICAL SPINE SURGERY     fusion   CESAREAN SECTION     x2   CHOLECYSTECTOMY     DILATION AND CURETTAGE OF UTERUS     ENDOMETRIAL ABLATION     KNEE ARTHROSCOPY  2016   OPEN REDUCTION INTERNAL FIXATION (ORIF) DISTAL RADIAL FRACTURE Right 03/10/2015   Procedure: OPEN TREATMENT OF RIGHT DISTAL RADIUS FRACTURE;  Surgeon: Mack Hook, MD;  Location: [facility];  Service: Orthopedics;  Laterality: Right;   PARTIAL KNEE ARTHROPLASTY Left 12/22/2015   Procedure: UNICOMPARTMENTAL KNEE;  Surgeon: Jodi Geralds, MD;  Location: [facility];  Service: Orthopedics;  Laterality: Left;   TONSILLECTOMY      Family History  Problem Relation Age of Onset   Cancer Mother    Kidney Stones Mother    Thyroid disease Mother    Thalassemia Mother    Hypertension Father    Cancer Father    Stroke Father    Diabetes Father    Cancer Sister    Heart failure Maternal Grandmother    Thalassemia Maternal Grandmother    Heart failure Maternal Grandfather    Diabetes Paternal Grandmother    Hypertension Paternal Grandfather    Stroke Paternal Grandfather     Social History   Socioeconomic History   Marital status: Married    Spouse name: Not on file   Number of children: 2   Years of education: Not on file   Highest education level:  Not on file  Occupational History   Occupation: Tax inspector    Comment: [employer]  Tobacco Use   Smoking status: Former Smoker    Years: 4.00    Quit date: 06/01/1991    Years since quitting: 29.2   Smokeless tobacco: Never Used  Substance and Sexual Activity   Alcohol use: No   Drug use: No   Sexual activity: Yes    Comment: esure coil  Other Topics Concern   Not on file  Social History Narrative   Not on file   Social Determinants of  Health   Financial Resource Strain: Not on file  Food Insecurity: Not on file  Transportation Needs: Not on file  Physical Activity: Not on file  Stress: Not on file  Social Connections: Not on file  Intimate Partner Violence: Not on file    Outpatient Medications Prior to Visit  Medication Sig Dispense Refill   famotidine (PEPCID) 40 MG tablet Take 40 mg by mouth at bedtime.     fluticasone (FLONASE) 50 MCG/ACT nasal spray Place 2 sprays into both nostrils daily. 16 g 6   levofloxacin (LEVAQUIN) 500 MG tablet Take 1 tablet (500 mg total) by mouth daily. 7 tablet 0   lisinopril (ZESTRIL) 5 MG tablet TAKE 1 TABLET BY MOUTH EVERY DAY 90 tablet 1   meloxicam (MOBIC) 15 MG tablet TAKE 1 TABLET BY MOUTH EVERY DAY 90 tablet 1   montelukast (SINGULAIR) 10 MG tablet Take 10 mg by mouth daily.     Multiple Vitamin (MULTIVITAMIN PO) Take by mouth.     PARoxetine (PAXIL) 40 MG tablet Take 40 mg by mouth daily.     No facility-administered medications prior to visit.    Allergies  Allergen Reactions   No Known Allergies     ROS Review of Systems  Constitutional: Negative for appetite change, fatigue and unexpected weight change.  HENT: Negative for congestion, ear pain, rhinorrhea, sinus pressure, sinus pain and tinnitus.   Eyes: Negative for pain.  Respiratory: Negative for cough and shortness of breath.   Cardiovascular: Negative for chest pain, palpitations and leg swelling.  Gastrointestinal: Negative for abdominal pain, constipation, diarrhea, nausea and vomiting.  Endocrine: Negative for cold intolerance, heat intolerance, polydipsia, polyphagia and polyuria.  Genitourinary: Negative for dysuria, frequency and hematuria.  Musculoskeletal: Negative for arthralgias, back pain, joint swelling and myalgias.  Skin: Negative for rash.  Allergic/Immunologic: Negative for environmental allergies.  Neurological: Negative for dizziness and headaches.  Hematological: Negative for  adenopathy.  Psychiatric/Behavioral: Negative for decreased concentration and sleep disturbance. The patient is not nervous/anxious.       Objective:    BP 128/72 (BP Location: Left Arm, Patient Position: Sitting)    Pulse 86    Temp (!) 97.3 F (36.3 C) (Temporal)    Ht 5\' 4"  (1.626 m)    Wt 206 lb (93.4 kg)    SpO2 96%    BMI 35.36 kg/m  Physical Exam Vitals reviewed.  Constitutional:      Appearance: Normal appearance.  HENT:     Head: Normocephalic.     Right Ear: Tympanic membrane normal.     Left Ear: Tympanic membrane normal.     Nose: Nose normal.     Mouth/Throat:     Mouth: Mucous membranes are moist.  Eyes:     Comments: Glasses in place  Neck:     Vascular: No carotid bruit.  Cardiovascular:     Rate and  Rhythm: Normal rate and regular rhythm.     Pulses: Normal pulses.     Heart sounds: Normal heart sounds.  Pulmonary:     Effort: Pulmonary effort is normal.     Breath sounds: Normal breath sounds.  Abdominal:     General: Bowel sounds are normal.     Palpations: Abdomen is soft.     Tenderness: There is no abdominal tenderness. There is no guarding.  Musculoskeletal:        General: No swelling.  Skin:    General: Skin is warm and dry.     Capillary Refill: Capillary refill takes less than 2 seconds.     Findings: Rash (petechiae type rash to anterior neck, non-prurtic) present.  Neurological:     Mental Status: She is alert and oriented to person, place, and time.  Psychiatric:        Mood and Affect: Mood normal.        Behavior: Behavior normal.    Wt Readings from Last 3 Encounters:  05/06/20 211 lb 3.2 oz (95.8 kg)  03/28/20 205 lb (93 kg)  02/08/20 210 lb 6.4 oz (95.4 kg)     Health Maintenance Due  Topic Date Due   PAP SMEAR-Modifier     MAMMOGRAM  01/21/2020    Lab Results  Component Value Date   WBC 8.3 02/08/2020   HGB 11.8 02/08/2020   HCT 38.2 02/08/2020   MCV 67 (L) 02/08/2020   PLT 352 02/08/2020   Lab Results   Component Value Date   NA 140 02/08/2020   K 4.3 02/08/2020   CO2 24 02/08/2020   GLUCOSE 127 (H) 02/08/2020   BUN 15 02/08/2020   CREATININE 0.71 02/08/2020   BILITOT 1.2 02/08/2020   ALKPHOS 79 02/08/2020   AST 25 02/08/2020   ALT 26 02/08/2020   PROT 7.1 02/08/2020   ALBUMIN 4.6 02/08/2020   CALCIUM 10.0 02/08/2020   ANIONGAP 6 12/12/2015   Lab Results  Component Value Date   CHOL 195 02/08/2020   Lab Results  Component Value Date   HDL 49 02/08/2020   Lab Results  Component Value Date   LDLCALC 123 (H) 02/08/2020   Lab Results  Component Value Date   TRIG 126 02/08/2020   Lab Results  Component Value Date   CHOLHDL 4.0 02/08/2020       Assessment & Plan:   1. Essential hypertension - CBC with Differential/Platelet - Comprehensive metabolic panel - TSH - Lipid panel  2. Allergic rhinitis, unspecified seasonality, unspecified trigger - Ambulatory referral to Allergy  3. Primary osteoarthritis of both knees  4. Thalassemia minor - CBC with Differential/Platelet  5. Family history of pancreatic cancer - CT ABDOMEN PELVIS W WO CONTRAST  6. BMI 35.0-35.9,adult - Lipid panel  7. Unintentional weight loss - CBC with Differential/Platelet - Comprehensive metabolic panel - TSH - CT ABDOMEN PELVIS W WO CONTRAST  8. Generalized abdominal tenderness, rebound tenderness presence not specified - CT ABDOMEN PELVIS W WO CONTRAST   Continue current medications Continue heart healthy diet and physical activity Follow up in 38-months  Follow-up: Return in about 6 months (around 02/21/2021).    Janie Morning, NP

## 2020-08-22 ENCOUNTER — Other Ambulatory Visit: Payer: Self-pay | Admitting: Nurse Practitioner

## 2020-08-22 DIAGNOSIS — R7301 Impaired fasting glucose: Secondary | ICD-10-CM

## 2020-08-22 LAB — CBC WITH DIFFERENTIAL/PLATELET
Basophils Absolute: 0.1 10*3/uL (ref 0.0–0.2)
Basos: 1 %
EOS (ABSOLUTE): 0.2 10*3/uL (ref 0.0–0.4)
Eos: 2 %
Hematocrit: 36.1 % (ref 34.0–46.6)
Hemoglobin: 11 g/dL — ABNORMAL LOW (ref 11.1–15.9)
Immature Grans (Abs): 0 10*3/uL (ref 0.0–0.1)
Immature Granulocytes: 0 %
Lymphocytes Absolute: 2 10*3/uL (ref 0.7–3.1)
Lymphs: 20 %
MCH: 20.7 pg — ABNORMAL LOW (ref 26.6–33.0)
MCHC: 30.5 g/dL — ABNORMAL LOW (ref 31.5–35.7)
MCV: 68 fL — ABNORMAL LOW (ref 79–97)
Monocytes Absolute: 0.7 10*3/uL (ref 0.1–0.9)
Monocytes: 7 %
Neutrophils Absolute: 6.9 10*3/uL (ref 1.4–7.0)
Neutrophils: 70 %
Platelets: 323 10*3/uL (ref 150–450)
RBC: 5.32 x10E6/uL — ABNORMAL HIGH (ref 3.77–5.28)
RDW: 14.2 % (ref 11.7–15.4)
WBC: 9.9 10*3/uL (ref 3.4–10.8)

## 2020-08-22 LAB — COMPREHENSIVE METABOLIC PANEL
ALT: 19 IU/L (ref 0–32)
AST: 23 IU/L (ref 0–40)
Albumin/Globulin Ratio: 1.7 (ref 1.2–2.2)
Albumin: 4.4 g/dL (ref 3.8–4.9)
Alkaline Phosphatase: 72 IU/L (ref 44–121)
BUN/Creatinine Ratio: 18 (ref 9–23)
BUN: 14 mg/dL (ref 6–24)
Bilirubin Total: 1.4 mg/dL — ABNORMAL HIGH (ref 0.0–1.2)
CO2: 22 mmol/L (ref 20–29)
Calcium: 9.8 mg/dL (ref 8.7–10.2)
Chloride: 104 mmol/L (ref 96–106)
Creatinine, Ser: 0.77 mg/dL (ref 0.57–1.00)
Globulin, Total: 2.6 g/dL (ref 1.5–4.5)
Glucose: 108 mg/dL — ABNORMAL HIGH (ref 65–99)
Potassium: 4.7 mmol/L (ref 3.5–5.2)
Sodium: 143 mmol/L (ref 134–144)
Total Protein: 7 g/dL (ref 6.0–8.5)
eGFR: 92 mL/min/{1.73_m2} (ref >59)

## 2020-08-22 LAB — LIPID PANEL
Chol/HDL Ratio: 3.3 ratio (ref 0.0–4.4)
Cholesterol, Total: 167 mg/dL (ref 100–199)
HDL: 50 mg/dL (ref >39)
LDL Chol Calc (NIH): 102 mg/dL — ABNORMAL HIGH (ref 0–99)
Triglycerides: 78 mg/dL (ref 0–149)
VLDL Cholesterol Cal: 15 mg/dL (ref 5–40)

## 2020-08-22 LAB — TSH: TSH: 0.793 u[IU]/mL (ref 0.450–4.500)

## 2020-08-22 LAB — HM MAMMOGRAPHY

## 2020-08-22 LAB — CARDIOVASCULAR RISK ASSESSMENT

## 2020-08-22 NOTE — Addendum Note (Signed)
Addended by: Janie Morning on: 08/22/2020 11:50 AM   Modules accepted: Orders

## 2020-08-25 LAB — SPECIMEN STATUS REPORT

## 2020-08-25 LAB — HGB A1C W/O EAG: Hgb A1c MFr Bld: 5.8 % — ABNORMAL HIGH (ref 4.8–5.6)

## 2020-08-26 ENCOUNTER — Telehealth: Payer: Self-pay

## 2020-08-26 NOTE — Telephone Encounter (Signed)
Carollee Herter pts ins [employer] is requiring a peer to peer for her CT Abdomen Pelvis w/wo contrast. They relayed that the ordering provider can call [phone] for a peer-to-peer discussion with an [employer] physician reviewer.

## 2020-09-01 ENCOUNTER — Telehealth (INDEPENDENT_AMBULATORY_CARE_PROVIDER_SITE_OTHER): Payer: BC Managed Care – PPO | Admitting: Nurse Practitioner

## 2020-09-01 ENCOUNTER — Encounter: Payer: Self-pay | Admitting: Nurse Practitioner

## 2020-09-01 VITALS — BP 139/78 | HR 80 | Ht 64.0 in | Wt 206.0 lb

## 2020-09-01 DIAGNOSIS — J301 Allergic rhinitis due to pollen: Secondary | ICD-10-CM | POA: Diagnosis not present

## 2020-09-01 DIAGNOSIS — J018 Other acute sinusitis: Secondary | ICD-10-CM

## 2020-09-01 MED ORDER — LORATADINE 10 MG PO TABS: 10.0000 mg | ORAL_TABLET | Freq: Every day | ORAL | 0 refills | Status: DC

## 2020-09-01 MED ORDER — AZITHROMYCIN 250 MG PO TABS: ORAL_TABLET | ORAL | 0 refills | Status: DC

## 2020-09-01 NOTE — Progress Notes (Signed)
Virtual Visit via Telephone Note   This visit type was conducted due to national recommendations for restrictions regarding the COVID-19 Pandemic (e.g. social distancing) in an effort to limit this patient's exposure and mitigate transmission in our community.  Due to her co-morbid illnesses, this patient is at least at moderate risk for complications without adequate follow up.  This format is felt to be most appropriate for this patient at this time.  The patient did not have access to video technology/had technical difficulties with video requiring transitioning to audio format only (telephone).  All issues noted in this document were discussed and addressed.  No physical exam could be performed with this format.  Patient verbally consented to a telehealth visit.   Date:  09/01/2020   ID:  Samantha Christensen, DOB December 30, 1965, MRN [id number]  Patient Location: Home Provider Location: Office/Clinic  PCP:  Janie Morning, NP   Evaluation Performed: Established patient, acute telemedicine visit  Chief Complaint: Sinus congestion, rhinorrhea  History of Present Illness:    Samantha Christensen is a 55 y.o. female with sinus congestion/pressure, rhinorrhea, post-nasal-drip, sore throat, and non-productive cough. Onset of symptoms was approximately 1-week-ago. Samantha Christensen has chronic allergic rhinitis. Treatment has included Mucinex XR, Singulair 10 mg, Flonase nasal spray, and Simply Saline nasal spray. She is scheduled to see allergist on 09/25/20. She was seen by Dr Marcheta Grammes, ENT, on 08/11/20 for septal deviation and bilateral turbinate hypertrophy. Decision for conservative treatment at this time.   The patient does not have symptoms concerning for COVID-19 infection (fever, chills, cough, or new shortness of breath).    Past Medical History:  Diagnosis Date  . Acute DVT (deep venous thrombosis) (HCC) 03/2015   Left Leg after knee replacement  . Bell's palsy affecting pregnancy in third trimester 1997    left  . DJD (degenerative joint disease)   . GERD (gastroesophageal reflux disease)   . Headache 2004   severe head ache - left no cause found- no problem since then  . Hypertension   . Nasal fracture    broke it when she broke her radius, stitches under nose.  Marland Kitchen PONV (postoperative nausea and vomiting)    No problem if she has the patch  . Seasonal allergies   . Shortness of breath dyspnea    with exertion  . Thalassemia- hemoglobin I disease 1985    Past Surgical History:  Procedure Laterality Date  . CERVICAL SPINE SURGERY     fusion  . CESAREAN SECTION     x2  . CHOLECYSTECTOMY    . DILATION AND CURETTAGE OF UTERUS    . ENDOMETRIAL ABLATION    . KNEE ARTHROSCOPY  2016  . OPEN REDUCTION INTERNAL FIXATION (ORIF) DISTAL RADIAL FRACTURE Right 03/10/2015   Procedure: OPEN TREATMENT OF RIGHT DISTAL RADIUS FRACTURE;  Surgeon: Mack Hook, MD;  Location: [facility];  Service: Orthopedics;  Laterality: Right;  . PARTIAL KNEE ARTHROPLASTY Left 12/22/2015   Procedure: UNICOMPARTMENTAL KNEE;  Surgeon: Jodi Geralds, MD;  Location: [facility];  Service: Orthopedics;  Laterality: Left;  . TONSILLECTOMY      Family History  Problem Relation Age of Onset  . Cancer Mother   . Kidney Stones Mother   . Thyroid disease Mother   . Thalassemia Mother   . Hypertension Father   . Cancer Father   . Stroke Father   . Diabetes Father   . Cancer Sister   . Heart failure Maternal Grandmother   . Thalassemia  Maternal Grandmother   . Heart failure Maternal Grandfather   . Diabetes Paternal Grandmother   . Hypertension Paternal Grandfather   . Stroke Paternal Grandfather     Social History   Socioeconomic History  . Marital status: Married    Spouse name: Not on file  . Number of children: 2  . Years of education: Not on file  . Highest education level: Not on file  Occupational History  . Occupation: Tax inspector    Comment: Acupuncturist  Tobacco Use  . Smoking status: Former Smoker    Years: 4.00    Quit date: 06/01/1991    Years since quitting: 29.2  . Smokeless tobacco: Never Used  Substance and Sexual Activity  . Alcohol use: No  . Drug use: No  . Sexual activity: Yes    Comment: esure coil  Other Topics Concern  . Not on file  Social History Narrative  . Not on file   Social Determinants of Health   Financial Resource Strain: Not on file  Food Insecurity: Not on file  Transportation Needs: Not on file  Physical Activity: Not on file  Stress: Not on file  Social Connections: Not on file  Intimate Partner Violence: Not on file    Outpatient Medications Prior to Visit  Medication Sig Dispense Refill  . famotidine (PEPCID) 40 MG tablet Take 40 mg by mouth at bedtime.    . fluticasone (FLONASE) 50 MCG/ACT nasal spray Place 2 sprays into both nostrils daily. 16 g 6  . lisinopril (ZESTRIL) 5 MG tablet TAKE 1 TABLET BY MOUTH EVERY DAY 90 tablet 1  . meloxicam (MOBIC) 15 MG tablet TAKE 1 TABLET BY MOUTH EVERY DAY 90 tablet 1  . montelukast (SINGULAIR) 10 MG tablet Take 10 mg by mouth daily.    . Multiple Vitamin (MULTIVITAMIN PO) Take by mouth.    Marland Kitchen PARoxetine (PAXIL) 40 MG tablet Take 40 mg by mouth daily.     No facility-administered medications prior to visit.    Allergies:   No known allergies   Social History   Tobacco Use  . Smoking status: Former Smoker    Years: 4.00    Quit date: 06/01/1991    Years since quitting: 29.2  . Smokeless tobacco: Never Used  Substance Use Topics  . Alcohol use: No  . Drug use: No     Review of Systems  Constitutional: Negative for chills, fever and malaise/fatigue.  HENT: Positive for congestion, sinus pain and sore throat.        Post-nasal-drip and rhinorrhea  Eyes: Negative.   Respiratory: Positive for cough. Negative for sputum production and shortness of breath.   Cardiovascular: Negative.   Gastrointestinal: Negative.   Genitourinary:  Negative.   Musculoskeletal: Negative.   Neurological: Positive for headaches.  Endo/Heme/Allergies: Negative.   Psychiatric/Behavioral: Negative.      Labs/Other Tests and Data Reviewed:    Recent Labs: 08/21/2020: ALT 19; BUN 14; Creatinine, Ser 0.77; Hemoglobin 11.0; Platelets 323; Potassium 4.7; Sodium 143; TSH 0.793   Recent Lipid Panel Lab Results  Component Value Date/Time   CHOL 167 08/21/2020 08:49 AM   TRIG 78 08/21/2020 08:49 AM   HDL 50 08/21/2020 08:49 AM   CHOLHDL 3.3 08/21/2020 08:49 AM   LDLCALC 102 (H) 08/21/2020 08:49 AM    Wt Readings from Last 3 Encounters:  08/21/20 206 lb (93.4 kg)  05/06/20 211 lb 3.2 oz (95.8 kg)  03/28/20 205 lb (93 kg)  Objective:    Vital Signs:  BP 139/78   Pulse 80   Ht 5\' 4"  (1.626 m)   Wt 206 lb (93.4 kg)   SpO2 97%   BMI 35.36 kg/m   Physical Exam Vitals reviewed.    No physical exam performed due to telemedicine visit  ASSESSMENT & PLAN:    1. Acute non-recurrent sinusitis of other sinus - azithromycin (ZITHROMAX) 250 MG tablet; Take two tablets by mouth on day one, take one tablet by mouth days two-five  Dispense: 6 tablet; Refill: 0  2. Seasonal allergic rhinitis due to pollen - loratadine (CLARITIN) 10 MG tablet; Take 1 tablet (10 mg total) by mouth daily.  Dispense: 90 tablet; Refill: 0   Rest and push fluids Continue Mucinex, Flonase, Saline, and Singulair as directed Notify office if symptoms fail to improve or worsen  COVID-19 Education: The signs and symptoms of COVID-19 were discussed with the patient and how to seek care for testing (follow up with PCP or arrange E-visit). The importance of social distancing was discussed today.   I spent 12 minutes dedicated to the care of this patient on the date of this encounter to include telephone time with the patient, as well as: EMR review and prescription medication management.   Follow Up:  In Person prn  Signed,  , NP  09/01/2020  1:41 PM    [facility] [place]

## 2020-09-23 ENCOUNTER — Other Ambulatory Visit: Payer: Self-pay | Admitting: Family Medicine

## 2020-09-24 ENCOUNTER — Other Ambulatory Visit: Payer: Self-pay

## 2020-09-24 ENCOUNTER — Encounter: Payer: Self-pay | Admitting: Nurse Practitioner

## 2020-09-24 ENCOUNTER — Ambulatory Visit: Payer: BC Managed Care – PPO | Admitting: Nurse Practitioner

## 2020-09-24 VITALS — BP 146/78 | HR 98 | Temp 97.8°F | Ht 64.0 in | Wt 205.0 lb

## 2020-09-24 DIAGNOSIS — H6501 Acute serous otitis media, right ear: Secondary | ICD-10-CM | POA: Diagnosis not present

## 2020-09-24 DIAGNOSIS — H6981 Other specified disorders of Eustachian tube, right ear: Secondary | ICD-10-CM

## 2020-09-24 DIAGNOSIS — J342 Deviated nasal septum: Secondary | ICD-10-CM | POA: Diagnosis not present

## 2020-09-24 DIAGNOSIS — J309 Allergic rhinitis, unspecified: Secondary | ICD-10-CM | POA: Diagnosis not present

## 2020-09-24 MED ORDER — TRIAMCINOLONE ACETONIDE 40 MG/ML IJ SUSP
60.0000 mg | Freq: Once | INTRAMUSCULAR | Status: AC
  Administered 2020-09-24: 60 mg via INTRAMUSCULAR

## 2020-09-24 MED ORDER — CEFDINIR 300 MG PO CAPS: 300.0000 mg | ORAL_CAPSULE | Freq: Two times a day (BID) | ORAL | 0 refills | Status: AC

## 2020-09-24 NOTE — Patient Instructions (Addendum)
Continue Claritin and Flonase for allergic rhinitis Take Omnicef 300 mg twice daily for 5 days Kenalog injection given for right eustachian tube dysfunction Notify office if no improvement or symptoms worsen Follow-up as needed  Eustachian Tube Dysfunction  Eustachian tube dysfunction refers to a condition in which a blockage develops in the narrow passage that connects the middle ear to the back of the nose (eustachian tube). The eustachian tube regulates air pressure in the middle ear by letting air move between the ear and nose. It also helps to drain fluid from the middle ear space. Eustachian tube dysfunction can affect one or both ears. When the eustachian tube does not function properly, air pressure, fluid, or both can build up in the middle ear. What are the causes? This condition occurs when the eustachian tube becomes blocked or cannot open normally. Common causes of this condition include:  Ear infections.  Colds and other infections that affect the nose, mouth, and throat (upper respiratory tract).  Allergies.  Irritation from cigarette smoke.  Irritation from stomach acid coming up into the esophagus (gastroesophageal reflux). The esophagus is the tube that carries food from the mouth to the stomach.  Sudden changes in air pressure, such as from descending in an airplane or scuba diving.  Abnormal growths in the nose or throat, such as: ? Growths that line the nose (nasal polyps). ? Abnormal growth of cells (tumors). ? Enlarged tissue at the back of the throat (adenoids). What increases the risk? You are more likely to develop this condition if:  You smoke.  You are overweight.  You are a child who has: ? Certain birth defects of the mouth, such as cleft palate. ? Large tonsils or adenoids. What are the signs or symptoms? Common symptoms of this condition include:  A feeling of fullness in the ear.  Ear pain.  Clicking or popping noises in the  ear.  Ringing in the ear.  Hearing loss.  Loss of balance.  Dizziness. Symptoms may get worse when the air pressure around you changes, such as when you travel to an area of high elevation, fly on an airplane, or go scuba diving. How is this diagnosed? This condition may be diagnosed based on:  Your symptoms.  A physical exam of your ears, nose, and throat.  Tests, such as those that measure: ? The movement of your eardrum (tympanogram). ? Your hearing (audiometry). How is this treated? Treatment depends on the cause and severity of your condition.  In mild cases, you may relieve your symptoms by moving air into your ears. This is called "popping the ears."  In more severe cases, or if you have symptoms of fluid in your ears, treatment may include: ? Medicines to relieve congestion (decongestants). ? Medicines that treat allergies (antihistamines). ? Nasal sprays or ear drops that contain medicines that reduce swelling (steroids). ? A procedure to drain the fluid in your eardrum (myringotomy). In this procedure, a small tube is placed in the eardrum to:  Drain the fluid.  Restore the air in the middle ear space. ? A procedure to insert a balloon device through the nose to inflate the opening of the eustachian tube (balloon dilation). Follow these instructions at home: Lifestyle  Do not do any of the following until your health care provider approves: ? Travel to high altitudes. ? Fly in airplanes. ? Work in a Estate agent or room. ? Scuba dive.  Do not use any products that contain nicotine or tobacco, such  as cigarettes and e-cigarettes. If you need help quitting, ask your health care provider.  Keep your ears dry. Wear fitted earplugs during showering and bathing. Dry your ears completely after. General instructions  Take over-the-counter and prescription medicines only as told by your health care provider.  Use techniques to help pop your ears as recommended  by your health care provider. These may include: ? Chewing gum. ? Yawning. ? Frequent, forceful swallowing. ? Closing your mouth, holding your nose closed, and gently blowing as if you are trying to blow air out of your nose.  Keep all follow-up visits as told by your health care provider. This is important. Contact a health care provider if:  Your symptoms do not go away after treatment.  Your symptoms come back after treatment.  You are unable to pop your ears.  You have: ? A fever. ? Pain in your ear. ? Pain in your head or neck. ? Fluid draining from your ear.  Your hearing suddenly changes.  You become very dizzy.  You lose your balance. Summary  Eustachian tube dysfunction refers to a condition in which a blockage develops in the eustachian tube.  It can be caused by ear infections, allergies, inhaled irritants, or abnormal growths in the nose or throat.  Symptoms include ear pain, hearing loss, or ringing in the ears.  Mild cases are treated with maneuvers to unblock the ears, such as yawning or ear popping.  Severe cases are treated with medicines. Surgery may also be done (rare). This information is not intended to replace advice given to you by your health care provider. Make sure you discuss any questions you have with your health care provider. Document Revised: 09/06/2017 Document Reviewed: 09/06/2017 Elsevier Patient Education  2021 [employer]  Otitis Media, Adult  Otitis media is a condition in which the middle ear is red and swollen (inflamed) and full of fluid. The middle ear is the part of the ear that contains bones for hearing as well as air that helps send sounds to the brain. The condition usually goes away on its own. What are the causes? This condition is caused by a blockage in the eustachian tube. The eustachian tube connects the middle ear to the back of the nose. It normally allows air into the middle ear. The blockage is caused by fluid or  swelling. Problems that can cause blockage include:  A cold or infection that affects the nose, mouth, or throat.  Allergies.  An irritant, such as tobacco smoke.  Adenoids that have become large. The adenoids are soft tissue located in the back of the throat, behind the nose and the roof of the mouth.  Growth or swelling in the upper part of the throat, just behind the nose (nasopharynx).  Damage to the ear caused by change in pressure. This is called barotrauma. What are the signs or symptoms? Symptoms of this condition include:  Ear pain.  Fever.  Problems with hearing.  Being tired.  Fluid leaking from the ear.  Ringing in the ear. How is this treated? This condition can go away on its own within 3-5 days. But if the condition is caused by bacteria or does not go away on its own, or if it keeps coming back, your doctor may:  Give you antibiotic medicines.  Give you medicines for pain. Follow these instructions at home:  Take over-the-counter and prescription medicines only as told by your doctor.  If you were prescribed an antibiotic medicine, take  it as told by your doctor. Do not stop taking the antibiotic even if you start to feel better.  Keep all follow-up visits as told by your doctor. This is important. Contact a doctor if:  You have bleeding from your nose.  There is a lump on your neck.  You are not feeling better in 5 days.  You feel worse instead of better. Get help right away if:  You have pain that is not helped with medicine.  You have swelling, redness, or pain around your ear.  You get a stiff neck.  You cannot move part of your face (paralysis).  You notice that the bone behind your ear hurts when you touch it.  You get a very bad headache. Summary  Otitis media means that the middle ear is red, swollen, and full of fluid.  This condition usually goes away on its own.  If the problem does not go away, treatment may be needed.  You may be given medicines to treat the infection or to treat your pain.  If you were prescribed an antibiotic medicine, take it as told by your doctor. Do not stop taking the antibiotic even if you start to feel better.  Keep all follow-up visits as told by your doctor. This is important. This information is not intended to replace advice given to you by your health care provider. Make sure you discuss any questions you have with your health care provider. Document Revised: 04/19/2019 Document Reviewed: 04/19/2019 Elsevier Patient Education  2021 [employer].

## 2020-09-24 NOTE — Progress Notes (Signed)
Acute Office Visit  Subjective:    Patient ID: Samantha Christensen, female    DOB: 09-May-1966, 55 y.o.   MRN: [id number]  Chief Complaint  Patient presents with  . Ear Pain    HPI Domonique is a 55 year old Caucasian female is in today for right ear pressure/popping. She recently completed a course of Azithromycin for sinusitis. She has chronic allergic rhinitis that is treated with Flonase nasal spray and Claritin 10 mg daily. Rosetta also has a left deviated septum. She sustained a fall 2-weeks ago that caused facial trauma, fractured right humerus, and left great toe fracture with dislocation. She is being followed by orthopedics for fractures. Tineka suspects she may have fractured her nose or worsened deviated septum during the fall. She states she does not have the same air flow through her left nostril compared to the right.   Past Medical History:  Diagnosis Date  . Acute DVT (deep venous thrombosis) ([place]) 03/2015   Left Leg after knee replacement  . Bell's palsy affecting pregnancy in third trimester 1997   left  . DJD (degenerative joint disease)   . GERD (gastroesophageal reflux disease)   . Headache 2004   severe head ache - left no cause found- no problem since then  . Hypertension   . Nasal fracture    broke it when she broke her radius, stitches under nose.  Marland Kitchen PONV (postoperative nausea and vomiting)    No problem if she has the patch  . Seasonal allergies   . Shortness of breath dyspnea    with exertion  . Thalassemia- hemoglobin I disease 1985    Past Surgical History:  Procedure Laterality Date  . CERVICAL SPINE SURGERY     fusion  . CESAREAN SECTION     x2  . CHOLECYSTECTOMY    . DILATION AND CURETTAGE OF UTERUS    . ENDOMETRIAL ABLATION    . KNEE ARTHROSCOPY  2016  . OPEN REDUCTION INTERNAL FIXATION (ORIF) DISTAL RADIAL FRACTURE Right 03/10/2015   Procedure: OPEN TREATMENT OF RIGHT DISTAL RADIUS FRACTURE;  Surgeon: Milly Jakob, MD;  Location: [facility];  Service: Orthopedics;  Laterality: Right;  . PARTIAL KNEE ARTHROPLASTY Left 12/22/2015   Procedure: UNICOMPARTMENTAL KNEE;  Surgeon: Dorna Leitz, MD;  Location: [facility];  Service: Orthopedics;  Laterality: Left;  . TONSILLECTOMY      Family History  Problem Relation Age of Onset  . Cancer Mother   . Kidney Stones Mother   . Thyroid disease Mother   . Thalassemia Mother   . Hypertension Father   . Cancer Father   . Stroke Father   . Diabetes Father   . Cancer Sister   . Heart failure Maternal Grandmother   . Thalassemia Maternal Grandmother   . Heart failure Maternal Grandfather   . Diabetes Paternal Grandmother   . Hypertension Paternal Grandfather   . Stroke Paternal Grandfather     Social History   Socioeconomic History  . Marital status: Married    Spouse name: Not on file  . Number of children: 2  . Years of education: Not on file  . Highest education level: Not on file  Occupational History  . Occupation: Microbiologist    Comment: Furniture conservator/restorer  Tobacco Use  . Smoking status: Former Smoker    Years: 4.00    Quit date: 06/01/1991    Years since quitting: 29.3  . Smokeless tobacco: Never Used  Substance and Sexual  Activity  . Alcohol use: No  . Drug use: No  . Sexual activity: Yes    Comment: esure coil  Other Topics Concern  . Not on file  Social History Narrative  . Not on file   Social Determinants of Health   Financial Resource Strain: Not on file  Food Insecurity: Not on file  Transportation Needs: Not on file  Physical Activity: Not on file  Stress: Not on file  Social Connections: Not on file  Intimate Partner Violence: Not on file    Outpatient Medications Prior to Visit  Medication Sig Dispense Refill  . famotidine (PEPCID) 10 MG tablet Take by mouth.    . fluticasone (FLONASE) 50 MCG/ACT nasal spray SPRAY 2 SPRAYS INTO EACH NOSTRIL EVERY DAY 48 mL 2  . HYDROcodone-acetaminophen (NORCO/VICODIN)  5-325 MG tablet Take 1 tablet by mouth every 6 (six) hours as needed.    Marland Kitchen lisinopril (ZESTRIL) 5 MG tablet TAKE 1 TABLET BY MOUTH EVERY DAY 90 tablet 1  . loratadine (CLARITIN) 10 MG tablet Take 1 tablet (10 mg total) by mouth daily. 90 tablet 0  . meloxicam (MOBIC) 15 MG tablet TAKE 1 TABLET BY MOUTH EVERY DAY 90 tablet 1  . montelukast (SINGULAIR) 10 MG tablet Take 10 mg by mouth daily.    . Multiple Vitamin (MULTIVITAMIN PO) Take by mouth.    Marland Kitchen PARoxetine (PAXIL) 40 MG tablet Take 40 mg by mouth daily.    Marland Kitchen tiZANidine (ZANAFLEX) 2 MG tablet Take 2 mg by mouth every 8 (eight) hours as needed.    Marland Kitchen azithromycin (ZITHROMAX) 250 MG tablet Take two tablets by mouth on day one, take one tablet by mouth days two-five 6 tablet 0  . famotidine (PEPCID) 40 MG tablet Take 40 mg by mouth at bedtime.     No facility-administered medications prior to visit.    Allergies  Allergen Reactions  . No Known Allergies     Review of Systems  Constitutional: Negative.   HENT: Positive for congestion, ear pain (right ear fullness,popping), rhinorrhea and sinus pressure. Negative for sinus pain and tinnitus.   Eyes: Negative.   Respiratory: Negative.   Cardiovascular: Negative.   Gastrointestinal: Negative.   Endocrine: Negative.   Genitourinary: Negative.   Musculoskeletal: Positive for arthralgias (right upper arm, left great toe). Negative for back pain, joint swelling and myalgias.  Skin: Positive for wound (healing wounds to left side of face). Negative for rash.  Allergic/Immunologic: Positive for environmental allergies.  Neurological: Negative.   Hematological: Negative for adenopathy.  Psychiatric/Behavioral: Negative.        Objective:    Physical Exam Vitals reviewed.  Constitutional:      Appearance: Normal appearance.  HENT:     Head: Normocephalic.     Right Ear: Tympanic membrane is erythematous.     Left Ear: Tympanic membrane normal.  Pulmonary:     Effort: Pulmonary  effort is normal.  Skin:    General: Skin is warm and dry.     Capillary Refill: Capillary refill takes less than 2 seconds.  Neurological:     General: No focal deficit present.     Mental Status: She is alert and oriented to person, place, and time.  Psychiatric:        Mood and Affect: Mood normal.        Behavior: Behavior normal.     BP (!) 146/78 (BP Location: Left Arm, Patient Position: Sitting)   Pulse 98   Temp 97.8 F (  36.6 C) (Temporal)   Ht _0  (1.626 m)   Wt 205 lb (93 kg)   SpO2 98%   BMI 35.19 kg/m  Wt Readings from Last 3 Encounters:  09/24/20 205 lb (93 kg)  09/01/20 206 lb (93.4 kg)  08/21/20 206 lb (93.4 kg)    Health Maintenance Due  Topic Date Due  . PAP SMEAR-Modifier  Never done  . MAMMOGRAM  01/21/2020       Lab Results  Component Value Date   TSH 0.793 08/21/2020   Lab Results  Component Value Date   WBC 9.9 08/21/2020   HGB 11.0 (L) 08/21/2020   HCT 36.1 08/21/2020   MCV 68 (L) 08/21/2020   PLT 323 08/21/2020   Lab Results  Component Value Date   NA 143 08/21/2020   K 4.7 08/21/2020   CO2 22 08/21/2020   GLUCOSE 108 (H) 08/21/2020   BUN 14 08/21/2020   CREATININE 0.77 08/21/2020   BILITOT 1.4 (H) 08/21/2020   ALKPHOS 72 08/21/2020   AST 23 08/21/2020   ALT 19 08/21/2020   PROT 7.0 08/21/2020   ALBUMIN 4.4 08/21/2020   CALCIUM 9.8 08/21/2020   ANIONGAP 6 12/12/2015   EGFR 92 08/21/2020   Lab Results  Component Value Date   CHOL 167 08/21/2020   Lab Results  Component Value Date   HDL 50 08/21/2020   Lab Results  Component Value Date   LDLCALC 102 (H) 08/21/2020   Lab Results  Component Value Date   TRIG 78 08/21/2020   Lab Results  Component Value Date   CHOLHDL 3.3 08/21/2020   Lab Results  Component Value Date   HGBA1C 5.8 (H) 08/21/2020       Assessment & Plan:    1. Dysfunction of right eustachian tube - triamcinolone acetonide (KENALOG-40) injection 60 mg  2. Allergic rhinitis,  unspecified seasonality, unspecified trigger  3. Deviated septum  4. Right acute serous otitis media, recurrence not specified - cefdinir (OMNICEF) 300 MG capsule; Take 1 capsule (300 mg total) by mouth 2 (two) times daily.  Dispense: 10 capsule; Refill: 0   Continue Claritin and Flonase for allergic rhinitis Take Omnicef 300 mg twice daily for 5 days Kenalog injection given for right eustachian tube dysfunction Notify office if no improvement or symptoms worsen Follow-up as needed  Follow-up:As needed  Signed, Rip Harbour, NP

## 2020-09-25 ENCOUNTER — Ambulatory Visit: Payer: BC Managed Care – PPO | Admitting: Allergy and Immunology

## 2020-09-26 ENCOUNTER — Telehealth: Payer: Self-pay | Admitting: Nurse Practitioner

## 2020-09-26 ENCOUNTER — Other Ambulatory Visit: Payer: Self-pay | Admitting: Nurse Practitioner

## 2020-09-26 DIAGNOSIS — H6981 Other specified disorders of Eustachian tube, right ear: Secondary | ICD-10-CM

## 2020-09-26 MED ORDER — PREDNISONE 10 MG PO TABS: 10.0000 mg | ORAL_TABLET | Freq: Two times a day (BID) | ORAL | 0 refills | Status: AC

## 2020-09-26 NOTE — Telephone Encounter (Signed)
Pt family called, stated pt initially had relief of right ear eustachian tube dysfunction after receiving Kenalog injection on 09/24/20. She is not experiencing right ear pressure, popping, and discomfort. Pt's son is getting married tomorrow. Will send Prednisone 10 mg BID for 10 days. Pt to notify office if symptoms fail to improve or worsen.

## 2020-10-06 ENCOUNTER — Other Ambulatory Visit: Payer: Self-pay | Admitting: Physician Assistant

## 2020-10-14 ENCOUNTER — Encounter: Payer: Self-pay | Admitting: Nurse Practitioner

## 2020-11-27 ENCOUNTER — Other Ambulatory Visit: Payer: Self-pay | Admitting: Nurse Practitioner

## 2020-11-27 DIAGNOSIS — J301 Allergic rhinitis due to pollen: Secondary | ICD-10-CM

## 2021-02-08 ENCOUNTER — Other Ambulatory Visit: Payer: Self-pay | Admitting: Nurse Practitioner

## 2021-02-20 ENCOUNTER — Ambulatory Visit: Payer: BC Managed Care – PPO | Admitting: Nurse Practitioner

## 2021-06-23 ENCOUNTER — Other Ambulatory Visit: Payer: Self-pay | Admitting: Family Medicine

## 2021-08-11 ENCOUNTER — Other Ambulatory Visit: Payer: Self-pay | Admitting: Nurse Practitioner
